# Patient Record
Sex: Male | Born: 1960 | Race: White | Hispanic: No | Marital: Married
Health system: Midwestern US, Community
[De-identification: ages and names within clinical notes are randomized; demographics above are authoritative.]

## PROBLEM LIST (undated history)

## (undated) DIAGNOSIS — E669 Obesity, unspecified: Secondary | ICD-10-CM

## (undated) DIAGNOSIS — C4491 Basal cell carcinoma of skin, unspecified: Secondary | ICD-10-CM

## (undated) DIAGNOSIS — R55 Syncope and collapse: Secondary | ICD-10-CM

## (undated) DIAGNOSIS — I10 Essential (primary) hypertension: Secondary | ICD-10-CM

## (undated) DIAGNOSIS — E079 Disorder of thyroid, unspecified: Secondary | ICD-10-CM

## (undated) DIAGNOSIS — K259 Gastric ulcer, unspecified as acute or chronic, without hemorrhage or perforation: Secondary | ICD-10-CM

## (undated) DIAGNOSIS — E7800 Pure hypercholesterolemia, unspecified: Secondary | ICD-10-CM

## (undated) DIAGNOSIS — N200 Calculus of kidney: Secondary | ICD-10-CM

## (undated) DIAGNOSIS — G4730 Sleep apnea, unspecified: Secondary | ICD-10-CM

## (undated) HISTORY — DX: Calculus of kidney: N20.0

## (undated) HISTORY — DX: Gastric ulcer, unspecified as acute or chronic, without hemorrhage or perforation: K25.9

## (undated) HISTORY — PX: LYMPH NODE BIOPSY: SHX201

## (undated) HISTORY — DX: Basal cell carcinoma of skin, unspecified: C44.91

## (undated) HISTORY — PX: BASAL CELL CARCINOMA EXCISION: SHX1214

## (undated) HISTORY — PX: TONSILLECTOMY: SUR1361

## (undated) HISTORY — DX: Syncope and collapse: R55

## (undated) HISTORY — DX: Sleep apnea, unspecified: G47.30

---

## 2009-03-18 DIAGNOSIS — E079 Disorder of thyroid, unspecified: Secondary | ICD-10-CM

## 2009-03-18 HISTORY — DX: Disorder of thyroid, unspecified: E07.9

## 2010-09-13 ENCOUNTER — Encounter

## 2013-03-18 DIAGNOSIS — G4730 Sleep apnea, unspecified: Secondary | ICD-10-CM

## 2013-03-18 HISTORY — PX: CARDIAC CATHETERIZATION: SHX172

## 2013-03-18 HISTORY — PX: CARDIOVASCULAR STRESS TEST: SHX262

## 2013-03-18 HISTORY — DX: Sleep apnea, unspecified: G47.30

## 2014-11-21 ENCOUNTER — Encounter (HOSPITAL_COMMUNITY): Payer: Self-pay | Admitting: Emergency Medicine

## 2014-11-21 ENCOUNTER — Emergency Department (INDEPENDENT_AMBULATORY_CARE_PROVIDER_SITE_OTHER)
Admission: EM | Admit: 2014-11-21 | Discharge: 2014-11-21 | Disposition: A | Discharge status: Home / Self Care | Payer: BLUE CROSS/BLUE SHIELD | Source: Home / Self Care | Attending: Family Medicine | Admitting: Family Medicine

## 2014-11-21 DIAGNOSIS — N410 Acute prostatitis: Secondary | ICD-10-CM

## 2014-11-21 HISTORY — DX: Essential (primary) hypertension: I10

## 2014-11-21 HISTORY — DX: Disorder of thyroid, unspecified: E07.9

## 2014-11-21 HISTORY — DX: Pure hypercholesterolemia, unspecified: E78.00

## 2014-11-21 LAB — POCT URINALYSIS DIP (DEVICE)
Bilirubin Urine: NEGATIVE
GLUCOSE, UA: NEGATIVE mg/dL
KETONES UR: NEGATIVE mg/dL
Leukocytes, UA: NEGATIVE
NITRITE: NEGATIVE
PH: 6.5 (ref 5.0–8.0)
PROTEIN: NEGATIVE mg/dL
Specific Gravity, Urine: 1.025 (ref 1.005–1.030)
UROBILINOGEN UA: 1 mg/dL (ref 0.0–1.0)

## 2014-11-21 MED ORDER — CIPROFLOXACIN HCL 500 MG PO TABS: 500.0000 mg | ORAL_TABLET | Freq: Two times a day (BID) | ORAL | Status: DC

## 2014-11-21 NOTE — Discharge Instructions (Signed)
Take all of medicine as directed, drink lots of fluids, see urologist recommended for recheck and for kidney stone eval.

## 2014-11-21 NOTE — ED Provider Notes (Signed)
CSN: [id number]     Arrival date & time 11/21/14  1618 History   First MD Initiated Contact with Patient 11/21/14 1642     Chief Complaint  Patient presents with  . Urinary Tract Infection   (Consider location/radiation/quality/duration/timing/severity/associated sxs/prior Treatment) Patient is a 54 y.o. male presenting with frequency. The history is provided by the patient.  Urinary Frequency This is a new problem. The current episode started more than 2 days ago. The problem has been gradually worsening. Associated symptoms include abdominal pain. Associated symptoms comments: Suprapubic pressure. Told he passed a kidney stone 10 d ago, had hematuria..    Past Medical History  Diagnosis Date  . Hypertension   . Thyroid disease   . High cholesterol    Past Surgical History  Procedure Laterality Date  . Tonsillectomy     No family history on file. Social History  Substance Use Topics  . Smoking status: Never Smoker   . Smokeless tobacco: None  . Alcohol Use: Yes    Review of Systems  Constitutional: Negative.  Negative for fever and chills.  Gastrointestinal: Positive for abdominal pain.  Genitourinary: Positive for dysuria, urgency and frequency. Negative for discharge, penile swelling, scrotal swelling, penile pain and testicular pain.  All other systems reviewed and are negative.   Allergies  Codeine  Home Medications   Prior to Admission medications   Medication Sig Start Date End Date Taking? Authorizing Provider  Levothyroxine Sodium (SYNTHROID PO) Take by mouth.   Yes Historical Provider, MD  OMEPRAZOLE PO Take by mouth.   Yes Historical Provider, MD  RAMIPRIL PO Take by mouth.   Yes Historical Provider, MD  Rosuvastatin Calcium (CRESTOR PO) Take by mouth.   Yes Historical Provider, MD  ciprofloxacin (CIPRO) 500 MG tablet Take 1 tablet (500 mg total) by mouth 2 (two) times daily. 11/21/14   Billy Fischer, MD   Meds Ordered and Administered this Visit   Medications - No data to display  BP 138/81 mmHg  Pulse 83  Temp(Src) 98.5 F (36.9 C) (Oral)  Resp 16  SpO2 97% No data found.   Physical Exam  Constitutional: He is oriented to person, place, and time. He appears well-developed and well-nourished. No distress.  Abdominal: Soft. Bowel sounds are normal. He exhibits no distension and no mass. There is no tenderness. There is no rebound and no guarding.  Genitourinary: Penis normal.  Musculoskeletal: Normal range of motion.  Neurological: He is alert and oriented to person, place, and time.  Skin: Skin is warm and dry.  Nursing note and vitals reviewed.   ED Course  Procedures (including critical care time)  Labs Review Labs Reviewed  POCT URINALYSIS DIP (DEVICE) - Abnormal; Notable for the following:    Hgb urine dipstick SMALL (*)    All other components within normal limits   U/a sm bld. Imaging Review No results found.   Visual Acuity Review  Right Eye Distance:   Left Eye Distance:   Bilateral Distance:    Right Eye Near:   Left Eye Near:    Bilateral Near:         MDM   1. Prostatitis, acute     rx cipro 500 bid for 2wks for prostatitis   Billy Fischer, MD 11/21/14 1715

## 2014-11-21 NOTE — ED Notes (Signed)
C/o bladder infection symptoms, onset 3 days ago

## 2015-02-08 ENCOUNTER — Encounter: Payer: Self-pay | Admitting: Gastroenterology

## 2015-02-08 ENCOUNTER — Ambulatory Visit (INDEPENDENT_AMBULATORY_CARE_PROVIDER_SITE_OTHER): Payer: BLUE CROSS/BLUE SHIELD | Admitting: Family Medicine

## 2015-02-08 ENCOUNTER — Encounter: Payer: Self-pay | Admitting: Family Medicine

## 2015-02-08 VITALS — BP 126/83 | HR 70 | Temp 98.3°F | Resp 20 | Ht 73.0 in | Wt 255.5 lb

## 2015-02-08 DIAGNOSIS — I10 Essential (primary) hypertension: Secondary | ICD-10-CM

## 2015-02-08 DIAGNOSIS — R748 Abnormal levels of other serum enzymes: Secondary | ICD-10-CM

## 2015-02-08 DIAGNOSIS — E034 Atrophy of thyroid (acquired): Secondary | ICD-10-CM | POA: Diagnosis not present

## 2015-02-08 DIAGNOSIS — Z7689 Persons encountering health services in other specified circumstances: Secondary | ICD-10-CM | POA: Insufficient documentation

## 2015-02-08 DIAGNOSIS — Z7189 Other specified counseling: Secondary | ICD-10-CM

## 2015-02-08 DIAGNOSIS — K219 Gastro-esophageal reflux disease without esophagitis: Secondary | ICD-10-CM

## 2015-02-08 DIAGNOSIS — R7989 Other specified abnormal findings of blood chemistry: Secondary | ICD-10-CM

## 2015-02-08 DIAGNOSIS — N200 Calculus of kidney: Secondary | ICD-10-CM | POA: Insufficient documentation

## 2015-02-08 DIAGNOSIS — Z0000 Encounter for general adult medical examination without abnormal findings: Secondary | ICD-10-CM

## 2015-02-08 DIAGNOSIS — E785 Hyperlipidemia, unspecified: Secondary | ICD-10-CM | POA: Insufficient documentation

## 2015-02-08 DIAGNOSIS — Z114 Encounter for screening for human immunodeficiency virus [HIV]: Secondary | ICD-10-CM

## 2015-02-08 DIAGNOSIS — L989 Disorder of the skin and subcutaneous tissue, unspecified: Secondary | ICD-10-CM

## 2015-02-08 DIAGNOSIS — E039 Hypothyroidism, unspecified: Secondary | ICD-10-CM | POA: Insufficient documentation

## 2015-02-08 DIAGNOSIS — Z6833 Body mass index (BMI) 33.0-33.9, adult: Secondary | ICD-10-CM | POA: Diagnosis not present

## 2015-02-08 DIAGNOSIS — K2100 Gastro-esophageal reflux disease with esophagitis, without bleeding: Secondary | ICD-10-CM

## 2015-02-08 DIAGNOSIS — E038 Other specified hypothyroidism: Secondary | ICD-10-CM | POA: Diagnosis not present

## 2015-02-08 DIAGNOSIS — N182 Chronic kidney disease, stage 2 (mild): Secondary | ICD-10-CM | POA: Insufficient documentation

## 2015-02-08 DIAGNOSIS — G4733 Obstructive sleep apnea (adult) (pediatric): Secondary | ICD-10-CM

## 2015-02-08 DIAGNOSIS — Z1159 Encounter for screening for other viral diseases: Secondary | ICD-10-CM | POA: Insufficient documentation

## 2015-02-08 DIAGNOSIS — Z1211 Encounter for screening for malignant neoplasm of colon: Secondary | ICD-10-CM | POA: Insufficient documentation

## 2015-02-08 LAB — CBC WITH DIFFERENTIAL/PLATELET
BASOS PCT: 0.4 % (ref 0.0–3.0)
Basophils Absolute: 0 10*3/uL (ref 0.0–0.1)
EOS PCT: 2.4 % (ref 0.0–5.0)
Eosinophils Absolute: 0.1 10*3/uL (ref 0.0–0.7)
HCT: 47.6 % (ref 39.0–52.0)
HEMOGLOBIN: 15.6 g/dL (ref 13.0–17.0)
Lymphocytes Relative: 20.9 % (ref 12.0–46.0)
Lymphs Abs: 1.2 10*3/uL (ref 0.7–4.0)
MCHC: 32.8 g/dL (ref 30.0–36.0)
MCV: 88.2 fl (ref 78.0–100.0)
MONOS PCT: 6.5 % (ref 3.0–12.0)
Monocytes Absolute: 0.4 10*3/uL (ref 0.1–1.0)
Neutro Abs: 3.9 10*3/uL (ref 1.4–7.7)
Neutrophils Relative %: 69.8 % (ref 43.0–77.0)
Platelets: 252 10*3/uL (ref 150.0–400.0)
RBC: 5.39 Mil/uL (ref 4.22–5.81)
RDW: 13.5 % (ref 11.5–15.5)
WBC: 5.6 10*3/uL (ref 4.0–10.5)

## 2015-02-08 LAB — COMPREHENSIVE METABOLIC PANEL
ALBUMIN: 4.8 g/dL (ref 3.5–5.2)
ALK PHOS: 51 U/L (ref 39–117)
ALT: 32 U/L (ref 0–53)
AST: 24 U/L (ref 0–37)
BUN: 21 mg/dL (ref 6–23)
CHLORIDE: 105 meq/L (ref 96–112)
CO2: 29 mEq/L (ref 19–32)
Calcium: 10 mg/dL (ref 8.4–10.5)
Creatinine, Ser: 1.44 mg/dL (ref 0.40–1.50)
GFR: 54.1 mL/min — AB (ref >60.00)
GLUCOSE: 102 mg/dL — AB (ref 70–99)
POTASSIUM: 4.7 meq/L (ref 3.5–5.1)
Sodium: 141 mEq/L (ref 135–145)
TOTAL PROTEIN: 7.2 g/dL (ref 6.0–8.3)
Total Bilirubin: 0.7 mg/dL (ref 0.2–1.2)

## 2015-02-08 LAB — TSH: TSH: 2.02 u[IU]/mL (ref 0.35–4.50)

## 2015-02-08 LAB — LIPID PANEL
CHOL/HDL RATIO: 3
Cholesterol: 147 mg/dL (ref 0–200)
HDL: 43.4 mg/dL (ref >39.00)
LDL CALC: 81 mg/dL (ref 0–99)
NONHDL: 103.2
Triglycerides: 109 mg/dL (ref 0.0–149.0)
VLDL: 21.8 mg/dL (ref 0.0–40.0)

## 2015-02-08 LAB — HEMOGLOBIN A1C: Hgb A1c MFr Bld: 6 % (ref 4.6–6.5)

## 2015-02-08 MED ORDER — FENOFIBRATE 145 MG PO TABS: ORAL_TABLET | ORAL | Status: DC

## 2015-02-08 MED ORDER — OMEPRAZOLE 20 MG PO CPDR: 20.0000 mg | DELAYED_RELEASE_CAPSULE | Freq: Every day | ORAL | Status: DC

## 2015-02-08 MED ORDER — CRESTOR 20 MG PO TABS: ORAL_TABLET | ORAL | Status: DC

## 2015-02-08 MED ORDER — RAMIPRIL 10 MG PO CAPS: 10.0000 mg | ORAL_CAPSULE | Freq: Every day | ORAL | Status: DC

## 2015-02-08 NOTE — Progress Notes (Signed)
Subjective:    Patient ID: Jared Gomez, male    DOB: Oct 08, 1960, 54 y.o.   MRN: [id number]  HPI  Patient presents for new patient establishment, he recently moved to [place] from [place] a few months ago.. All past medical history, surgical history, allergies, family history, immunizations and social history was obtained from the patient today and entered into the electronic medical record. Records are requested from her prior PCP, and will be reviewed at the time they are received. All medical records will be updated at that time.  Nephrolithiasis: Patient states that approximately 6 weeks ago he was in [place] and suffered from a kidney stone. He states it was left-sided and he was seen in the emergency room given medications and sent home. He reports that he has not had urology follow-up, but is in need of getting a local urologist to follow-up on this kidney stone. It started first kidney stone he has ever had. He also states that he had experienced prostatitis that had also been treated shortly after this kidney stone. Patient also states he has had a history of elevated creatinine from an episode of dysentery in [place] many years ago.   Health maintenance:  Colonoscopy: Never had a screening, No Fhx. Immunizations: declined flu shot, Tdap indicated.  Infectious disease screening: HIV and Hep C indicated PSA: Patient states that he has had normal PSAs in the past. He declines GU exam today. Will defer PSA to urology since she's had a recent prostatitis.  Past Medical History  Diagnosis Date  . Hypertension   . High cholesterol   . Cancer ([place])     skin  . Sleep apnea 2015    CPAP  . Thyroid disease 2011  . Nephrolithiasis left    2016   Allergies  Allergen Reactions  . Codeine    Past Surgical History  Procedure Laterality Date  . Tonsillectomy    . Lymph node biopsy    . Basal cell carcinoma excision    . Cardiac catheterization  2013    Normal   Family  History  Problem Relation Age of Onset  . Lung cancer Maternal Grandfather   . Parkinson's disease Father   . Heart disease Father    Social History   Social History  . Marital Status: Married    Spouse Name: N/A  . Number of Children: N/A  . Years of Education: N/A   Occupational History  . Not on file.   Social History Main Topics  . Smoking status: Never Smoker   . Smokeless tobacco: Never Used  . Alcohol Use: Yes     Comment: Occassional   . Drug Use: No  . Sexual Activity: Yes   Other Topics Concern  . Not on file   Social History Narrative   Married to [place]. 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   Master's Degree.   Children: 2 kids   Wears seat belt, smoke detector located in the home, firearms located in the home a locked case.   Patient feels safe in his relationships.    Review of Systems Negative, with the exception of above mentioned in HPI     Objective:   Physical Exam BP 126/83 mmHg  Pulse 70  Temp(Src) 98.3 F (36.8 C) (Oral)  Resp 20  Ht 6' 1" (1.854 m)  Wt 255 lb 8 oz (115.894 kg)  BMI 33.72 kg/m2  SpO2 96% Gen: Afebrile. No acute distress. Nontoxic appearance, well-developed, well-nourished, Caucasian male. Mildly  obese. Very pleasant. HENT: AT. [place]. Bilateral TM visualized and normal in appearance. External auditory canal within normal limits. MMM. No oral lesions. Bilateral nares without erythema or swelling. Throat without erythema or exudates. No cough on exam, no hoarseness on exam. Good dentition Eyes:Pupils Equal Round Reactive to light, Extraocular movements intact,  Conjunctiva without redness, discharge or icterus. Neck/lymp/endocrine: Supple, no lymphadenopathy, no thyromegaly CV: RRR no murmur clicks gallops or rubs, no edema, +2/4 P posterior tibialis pulses Chest: CTAB, no wheeze or crackles Abd: Soft. Obese. NTND. BS present. No Masses palpated. No hepatosplenomegaly MSK: No erythema, no joint swelling, no obvious deformities.  Full range of motion. Neurovascular intact distally. Skin: No rashes, purpura or petechiae. 0.5cm  hyperpidmented, flaky lesion  Neuro: Normal gait. PERLA. EOMi. Alert. Oriented x3 Cranial nerves II through XII intact. Muscle strength 5/5 upper and lower extremity. DTRs equal bilaterally. Psych: Normal affect, dress and demeanor. Normal speech. Normal thought content and judgment..     Assessment & Plan:  Jared Gomez is a 54 y.o. male present for establishment of care. 1. Essential hypertension, benign - Stable, refills called in today. - ramipril (ALTACE) 10 MG capsule; Take 1 capsule (10 mg total) by mouth daily.  Dispense: 90 capsule; Refill: 1 - Comp Met (CMET) - CBC w/Diff  2. Elevated serum creatinine - Avoidance of nephrotoxic agents - BMP  3. Hyperlipidemia - Refills on Crestor. Encouraged low saturated fat diet, plenty of fresh fruits and vegetables and lean meats. - CRESTOR 20 MG tablet; TK 1 T PO QD  Dispense: 90 tablet; Refill: 1 - fenofibrate (TRICOR) 145 MG tablet; TK 1 T PO QD  Dispense: 90 tablet; Refill: 1 - Lipid panel  4. Obstructive sleep apnea - Continue sleep apnea daily at bedtime  5. Hypothyroidism due to acquired atrophy of thyroid - TSH - Recheck TSH today, if the normal range for refill levothyroxine 50 g  6. BMI 33.0-33.9,adult - HgB A1c - Encouraged greater than 150 minutes of exercise daily. Diet plenty of fresh fruits and vegetables, lean meats  7. Nephrolithiasis - Currently asymptomatic, no follow-up after ED visit in [place] 6 weeks ago. Also with prostatitis shortly after. Have placed urology referral. - Defer prostate cancer screening to urology given recent prostatitis - Ambulatory referral to Urology  8. Colon cancer screening - Never has had colon cancer screening, no family history. - Ambulatory referral to Gastroenterology  9. Screening for HIV (human immunodeficiency virus) - Patient amendable to HIV screening - HIV antibody  (with reflex)  10. Need for hepatitis C screening test - Patient amendable to hepatitis C screening - Hepatitis C Antibody  11. Gastroesophageal reflux disease without esophagitis - Continue omeprazole, refills: Today - omeprazole (PRILOSEC) 20 MG capsule; Take 1 capsule (20 mg total) by mouth daily.  Dispense: 90 capsule; Refill: 1  12. Back skin lesion - Patient to monitor left lower back lesion, marked for patient today. Patient has a history of basal cell in the past. If small area of concern does not heal within 2-4 weeks will refer to dermatology.  14. Encounter for preventive health examination Health maintenance:  Colonoscopy: Never had a screening, No Fhx. Referral to gastroenterology for screening colonoscopy placed today.  Immunizations: declined flu shot, Tdap indicated and patient will schedule a nurse visit to have his tetanus completed after the holidays. Infectious disease screening: HIV and Hep C collected today PSA: Patient states that he has had normal PSAs in the past. He declines GU exam today. Will  defer PSA to urology since she's had a recent prostatitis. AVS for health maintenance education given to patient today. He was encouraged to exercise greater than 150 minutes a week. Eat a diet full of fresh fruits and vegetables and lean meats.  Follow-up 6 months for chronic conditions/hypertension

## 2015-02-08 NOTE — Patient Instructions (Addendum)

## 2015-02-09 LAB — HEPATITIS C ANTIBODY: HCV Ab: NEGATIVE

## 2015-02-09 LAB — HIV ANTIBODY (ROUTINE TESTING W REFLEX): HIV: NONREACTIVE

## 2015-02-13 ENCOUNTER — Telehealth: Payer: Self-pay | Admitting: Family Medicine

## 2015-02-13 DIAGNOSIS — R7309 Other abnormal glucose: Secondary | ICD-10-CM | POA: Insufficient documentation

## 2015-02-13 MED ORDER — LEVOTHYROXINE SODIUM 50 MCG PO TABS: ORAL_TABLET | ORAL | Status: DC

## 2015-02-13 NOTE — Telephone Encounter (Signed)
Please call pt, all of his labs are good. With the exception of his a1c, is mildly elevated to 6.0. We will need to monitor this every 6 months, since it puts him at risk for developing diabetes. He should be encouraged to make dietary modifications (healthy fresh vegetables and fruits, lean meats, higher fiber) and execise at least 150  Minutes a week.  - His thyroid medication has also been called in .  - followup 6 months with a1c before rooming.

## 2015-02-13 NOTE — Telephone Encounter (Signed)
LMOM for pt to CB.  

## 2015-02-14 NOTE — Telephone Encounter (Signed)
Pt aware of lab results.  Scheduled 6 month f/u 07/17/15.  Pt had no questions at this time.

## 2015-02-16 DIAGNOSIS — R55 Syncope and collapse: Secondary | ICD-10-CM

## 2015-02-16 HISTORY — DX: Syncope and collapse: R55

## 2015-02-21 ENCOUNTER — Telehealth: Payer: Self-pay | Admitting: *Deleted

## 2015-02-21 MED ORDER — ROSUVASTATIN CALCIUM 20 MG PO TABS
20.0000 mg | ORAL_TABLET | Freq: Every day | ORAL | Status: DC
Start: 2015-02-21 — End: 2015-07-01

## 2015-02-21 NOTE — Telephone Encounter (Signed)
Resent Rx for generic crestor.

## 2015-02-21 NOTE — Telephone Encounter (Signed)
Received a call from Marty Heck at [facility] he states they received a Rx for Crestor 20 mg brand only. They want to know if it was supposed to be brand only because this would cost the patient a significant amount of money or can this be dispensed as generic? Please advise

## 2015-02-21 NOTE — Telephone Encounter (Signed)
I do not believe he needs brand only. Refills were given on new pt appt. On whatever was already in the computer. If pt does not have a preference, I do not and order can be changed

## 2015-03-28 ENCOUNTER — Encounter: Payer: Self-pay | Admitting: Family Medicine

## 2015-04-14 ENCOUNTER — Encounter: Payer: BLUE CROSS/BLUE SHIELD | Admitting: Gastroenterology

## 2015-05-30 ENCOUNTER — Other Ambulatory Visit: Payer: Self-pay | Admitting: *Deleted

## 2015-07-01 ENCOUNTER — Other Ambulatory Visit: Payer: Self-pay | Admitting: Family Medicine

## 2015-07-23 ENCOUNTER — Other Ambulatory Visit: Payer: Self-pay | Admitting: Family Medicine

## 2015-07-24 ENCOUNTER — Encounter: Payer: Self-pay | Admitting: Family Medicine

## 2015-07-24 ENCOUNTER — Ambulatory Visit (INDEPENDENT_AMBULATORY_CARE_PROVIDER_SITE_OTHER): Payer: BLUE CROSS/BLUE SHIELD | Admitting: Family Medicine

## 2015-07-24 VITALS — BP 133/88 | HR 70 | Temp 98.4°F | Resp 20 | Ht 73.0 in | Wt 252.5 lb

## 2015-07-24 DIAGNOSIS — L989 Disorder of the skin and subcutaneous tissue, unspecified: Secondary | ICD-10-CM | POA: Diagnosis not present

## 2015-07-24 DIAGNOSIS — R7309 Other abnormal glucose: Secondary | ICD-10-CM | POA: Diagnosis not present

## 2015-07-24 DIAGNOSIS — I10 Essential (primary) hypertension: Secondary | ICD-10-CM

## 2015-07-24 DIAGNOSIS — E785 Hyperlipidemia, unspecified: Secondary | ICD-10-CM

## 2015-07-24 DIAGNOSIS — Z6833 Body mass index (BMI) 33.0-33.9, adult: Secondary | ICD-10-CM | POA: Diagnosis not present

## 2015-07-24 DIAGNOSIS — E034 Atrophy of thyroid (acquired): Secondary | ICD-10-CM

## 2015-07-24 DIAGNOSIS — Z125 Encounter for screening for malignant neoplasm of prostate: Secondary | ICD-10-CM

## 2015-07-24 DIAGNOSIS — E038 Other specified hypothyroidism: Secondary | ICD-10-CM

## 2015-07-24 LAB — POCT GLYCOSYLATED HEMOGLOBIN (HGB A1C): HEMOGLOBIN A1C: 5.7

## 2015-07-24 MED ORDER — RAMIPRIL 10 MG PO CAPS: 10.0000 mg | ORAL_CAPSULE | Freq: Every day | ORAL | Status: DC

## 2015-07-24 NOTE — Progress Notes (Signed)
Patient ID: Jared Gomez, male   DOB: 1960-06-14, 55 y.o.   MRN: [id number]   Subjective:    Patient ID: Jared Gomez, male    DOB: 23-Mar-1960, 55 y.o.   MRN: [id number]  HPI  Prediabetes: patient was seen 6 months ago with an elevated a1c of 6.0 on his physical. He has since started a daily exercise program and stopped eating sweets. He has lost 3 pounds.   Hypertension/hyperlipidemia:: Patient reports compliance with his medications Altace, Crestor, tricor. He continues to watch his diet and exercise routinely now. He denies chest pain, shortness of breath or LE edema. He will need refills on his Altace today.   Hypothyroid: Patient reports compliance with medications. Denies flushing, palpitations, constipation or fatigue.   Skin Lesion: Pt feels it is basically unchanged skin lesion of his lower back. He states he was watching it for awhile, but then forgot about it. He has had a BCC on his back prior. He is established with dermatology.    Past Medical History  Diagnosis Date  . Hypertension   . High cholesterol   . Cancer ([place])     skin  . Sleep apnea 2015    CPAP  . Thyroid disease 2011  . Nephrolithiasis left    2016  . Gastric ulcer     prior PCP records  . Near syncope 02/2015    "dizziness", improved on own, no work upper prior pcp notes   Allergies  Allergen Reactions  . Codeine    Past Surgical History  Procedure Laterality Date  . Tonsillectomy    . Lymph node biopsy    . Basal cell carcinoma excision    . Cardiac catheterization  2015    Normal per [facility] PCP record  . Cardiovascular stress test  2015    normal per [facility] PCP record   Family History  Problem Relation Age of Onset  . Lung cancer Maternal Grandfather   . Parkinson's disease Father     died age 82  . Heart disease Father     stent placement ~61 yo  . Graves' disease Brother    Social History   Social History  . Marital Status: Married    Spouse Name: N/A  . Number of Children: N/A    . Years of Education: N/A   Occupational History  . Not on file.   Social History Main Topics  . Smoking status: Never Smoker   . Smokeless tobacco: Never Used  . Alcohol Use: Yes     Comment: Occassional   . Drug Use: No  . Sexual Activity: Yes   Other Topics Concern  . 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   Master's Degree.   Children: 2 kids   Wears seat belt, smoke detector located in the home, firearms located in the home a locked case.   Patient feels safe in his relationships.    Review of Systems Negative, with the exception of above mentioned in HPI     Objective:   Physical Exam BP 133/88 mmHg  Pulse 70  Temp(Src) 98.4 F (36.9 C) (Oral)  Resp 20  Ht 6\' 1"  (1.854 m)  Wt 252 lb 8 oz (114.533 kg)  BMI 33.32 kg/m2  SpO2 95% Gen: Afebrile. No acute distress. Nontoxic appearance, well-developed, well-nourished, Caucasian male. Mildly obese. Very pleasant. HENT: AT. .MMM. No oral lesions. Eyes:Pupils Equal Round Reactive to light, Extraocular movements intact,  Conjunctiva without redness, discharge or icterus. Neck/lymp/endocrine: Supple,  no thyromegaly CV: RRR no murmur clicks gallops or rubs, no edema, +2/4 P posterior tibialis pulses Chest: CTAB, no wheeze or crackles Abd: Soft. Obese. NTND. BS present. No Masses palpated. No hepatosplenomegaly Skin: ~1 mm oval red scaly lesion LL back.  Neuro: Normal gait. PERLA. EOMi. Alert. Oriented x3  Psych: Normal affect, dress and demeanor. Normal speech. Normal thought content and judgment.    Assessment & Plan:  Jared Gomez is a 55 y.o. male present for chronic medical condition f/u.  1. Essential hypertension, benign - Stable, refills called in today. - Low sodium diet, daily exercise > 150 minutes a week.  - ramipril (ALTACE) 10 MG capsule; Take 1 capsule (10 mg total) by mouth daily.  Dispense: 90 capsule; Refill: 1  2.  Hyperlipidemia -Encouraged low saturated fat diet, plenty of fresh fruits and vegetables and lean meats. - Continue: CRESTOR 20 MG tablet; TK 1 T PO QD  Dispense: 90 tablet; Refill: 1 - Continue: fenofibrate (TRICOR) 145 MG tablet; TK 1 T PO QD  Dispense: 90 tablet; Refill: 1  3. Hypothyroidism due to acquired atrophy of thyroid - Continue:  levothyroxine 50 g  4. Colon cancer screening - Never has had colon cancer screening, no family history. - Ambulatory referral to Gastroenterology, he had an appt, but needs to reschedule.   5. Back skin lesion - pt encouraged to follow with dermatology; discussed likelihood of basal cell.    Follow-up 6 months for CPE, with fasting labs prior.

## 2015-07-24 NOTE — Patient Instructions (Signed)
Prediabetes Eating Plan Prediabetes--also called impaired glucose tolerance or impaired fasting glucose--is a condition that causes blood sugar (blood glucose) levels to be higher than normal. Following a healthy diet can help to keep prediabetes under control. It can also help to lower the risk of type 2 diabetes and heart disease, which are increased in people who have prediabetes. Along with regular exercise, a healthy diet:  Promotes weight loss.  Helps to control blood sugar levels.  Helps to improve the way that the body uses insulin. WHAT DO I NEED TO KNOW ABOUT THIS EATING PLAN?  Use the glycemic index (GI) to plan your meals. The index tells you how quickly a food will raise your blood sugar. Choose low-GI foods. These foods take a longer time to raise blood sugar.  Pay close attention to the amount of carbohydrates in the food that you eat. Carbohydrates increase blood sugar levels.  Keep track of how many calories you take in. Eating the right amount of calories will help you to achieve a healthy weight. Losing about 7 percent of your starting weight can help to prevent type 2 diabetes.  You may want to follow a Mediterranean diet. This diet includes a lot of vegetables, lean meats or fish, whole grains, fruits, and healthy oils and fats. WHAT FOODS CAN I EAT? Grains Whole grains, such as whole-wheat or whole-grain breads, crackers, cereals, and pasta. Unsweetened oatmeal. Bulgur. Barley. Quinoa. Brown rice. Corn or whole-wheat flour tortillas or taco shells. Vegetables Lettuce. Spinach. Peas. Beets. Cauliflower. Cabbage. Broccoli. Carrots. Tomatoes. Squash. Eggplant. Herbs. Peppers. Onions. Cucumbers. Brussels sprouts. Fruits Berries. Bananas. Apples. Oranges. Grapes. Papaya. Mango. Pomegranate. Kiwi. Grapefruit. Cherries. Meats and Other Protein Sources Seafood. Lean meats, such as chicken and [place] or lean cuts of pork and beef. Tofu. Eggs. Nuts. Beans. Dairy Low-fat or  fat-free dairy products, such as yogurt, cottage cheese, and cheese. Beverages Water. Tea. Coffee. Sugar-free or diet soda. Seltzer water. Milk. Milk alternatives, such as soy or almond milk. Condiments Mustard. Relish. Low-fat, low-sugar ketchup. Low-fat, low-sugar barbecue sauce. Low-fat or fat-free mayonnaise. Sweets and Desserts Sugar-free or low-fat pudding. Sugar-free or low-fat ice cream and other frozen treats. Fats and Oils Avocado. Walnuts. Olive oil. The items listed above may not be a complete list of recommended foods or beverages. Contact your dietitian for more options.  WHAT FOODS ARE NOT RECOMMENDED? Grains Refined white flour and flour products, such as bread, pasta, snack foods, and cereals. Beverages Sweetened drinks, such as sweet iced tea and soda. Sweets and Desserts Baked goods, such as cake, cupcakes, pastries, cookies, and cheesecake. The items listed above may not be a complete list of foods and beverages to avoid. Contact your dietitian for more information.   This information is not intended to replace advice given to you by your health care provider. Make sure you discuss any questions you have with your health care provider.   Document Released: 07/19/2014 Document Reviewed: 07/19/2014 Elsevier Interactive Patient Education [employer].  Follow up in 6 months (after 02/09/2016) for your physical.  Come fasting so we can obtain blood work. Try to schedule  A lab appt a few days before your appt so we will have results.

## 2015-11-01 ENCOUNTER — Other Ambulatory Visit: Payer: Self-pay | Admitting: *Deleted

## 2015-11-01 DIAGNOSIS — I10 Essential (primary) hypertension: Secondary | ICD-10-CM

## 2015-11-01 MED ORDER — FENOFIBRATE 145 MG PO TABS: 145.0000 mg | ORAL_TABLET | Freq: Every day | ORAL | 0 refills | Status: DC

## 2015-11-01 MED ORDER — RAMIPRIL 10 MG PO CAPS: 10.0000 mg | ORAL_CAPSULE | Freq: Every day | ORAL | 0 refills | Status: DC

## 2015-11-01 MED ORDER — LEVOTHYROXINE SODIUM 50 MCG PO TABS: ORAL_TABLET | ORAL | 0 refills | Status: DC

## 2015-11-01 MED ORDER — OMEPRAZOLE 20 MG PO CPDR: 20.0000 mg | DELAYED_RELEASE_CAPSULE | Freq: Every day | ORAL | 0 refills | Status: DC

## 2015-11-01 MED ORDER — ROSUVASTATIN CALCIUM 20 MG PO TABS: 20.0000 mg | ORAL_TABLET | Freq: Every day | ORAL | 0 refills | Status: DC

## 2015-11-01 NOTE — Telephone Encounter (Signed)
[facility]  RF request for ramipril LOV: 07/24/15 Next ov: 02/23/16 Last written: 07/24/15 #90 w/ 1RF  RF request for tricor Last written: 07/03/15 #90 w/ 1RF  RF request for omeprazole Last written: 07/24/15 #90 w/ 1RF  RF request for crestor Last written: 07/03/15 #90 w/ 1RF  RF request for levothyroxine Last written: 07/03/15 #90 w/ 1Rf  02/08/15 TSH 2.02

## 2016-02-05 ENCOUNTER — Other Ambulatory Visit: Payer: Self-pay | Admitting: *Deleted

## 2016-02-20 ENCOUNTER — Other Ambulatory Visit (INDEPENDENT_AMBULATORY_CARE_PROVIDER_SITE_OTHER): Payer: BLUE CROSS/BLUE SHIELD

## 2016-02-20 DIAGNOSIS — I10 Essential (primary) hypertension: Secondary | ICD-10-CM

## 2016-02-20 DIAGNOSIS — L989 Disorder of the skin and subcutaneous tissue, unspecified: Secondary | ICD-10-CM | POA: Diagnosis not present

## 2016-02-20 DIAGNOSIS — Z125 Encounter for screening for malignant neoplasm of prostate: Secondary | ICD-10-CM

## 2016-02-20 DIAGNOSIS — R7309 Other abnormal glucose: Secondary | ICD-10-CM | POA: Diagnosis not present

## 2016-02-20 DIAGNOSIS — Z6833 Body mass index (BMI) 33.0-33.9, adult: Secondary | ICD-10-CM

## 2016-02-20 DIAGNOSIS — E034 Atrophy of thyroid (acquired): Secondary | ICD-10-CM

## 2016-02-20 DIAGNOSIS — E785 Hyperlipidemia, unspecified: Secondary | ICD-10-CM

## 2016-02-20 LAB — CBC WITH DIFFERENTIAL/PLATELET
BASOS PCT: 0.2 % (ref 0.0–3.0)
Basophils Absolute: 0 10*3/uL (ref 0.0–0.1)
EOS PCT: 1.1 % (ref 0.0–5.0)
Eosinophils Absolute: 0.1 10*3/uL (ref 0.0–0.7)
HEMATOCRIT: 47.4 % (ref 39.0–52.0)
HEMOGLOBIN: 16.1 g/dL (ref 13.0–17.0)
LYMPHS PCT: 10.3 % — AB (ref 12.0–46.0)
Lymphs Abs: 1.3 10*3/uL (ref 0.7–4.0)
MCHC: 34 g/dL (ref 30.0–36.0)
MCV: 87.2 fl (ref 78.0–100.0)
MONOS PCT: 4.8 % (ref 3.0–12.0)
Monocytes Absolute: 0.6 10*3/uL (ref 0.1–1.0)
Neutro Abs: 10.4 10*3/uL — ABNORMAL HIGH (ref 1.4–7.7)
Neutrophils Relative %: 83.6 % — ABNORMAL HIGH (ref 43.0–77.0)
Platelets: 254 10*3/uL (ref 150.0–400.0)
RBC: 5.44 Mil/uL (ref 4.22–5.81)
RDW: 13.6 % (ref 11.5–15.5)
WBC: 12.5 10*3/uL — AB (ref 4.0–10.5)

## 2016-02-20 LAB — COMPREHENSIVE METABOLIC PANEL
ALBUMIN: 5 g/dL (ref 3.5–5.2)
ALK PHOS: 47 U/L (ref 39–117)
ALT: 38 U/L (ref 0–53)
AST: 27 U/L (ref 0–37)
BUN: 20 mg/dL (ref 6–23)
CALCIUM: 10 mg/dL (ref 8.4–10.5)
CHLORIDE: 105 meq/L (ref 96–112)
CO2: 28 mEq/L (ref 19–32)
CREATININE: 1.45 mg/dL (ref 0.40–1.50)
GFR: 53.47 mL/min — ABNORMAL LOW (ref >60.00)
Glucose, Bld: 105 mg/dL — ABNORMAL HIGH (ref 70–99)
POTASSIUM: 4.9 meq/L (ref 3.5–5.1)
Sodium: 141 mEq/L (ref 135–145)
Total Bilirubin: 0.7 mg/dL (ref 0.2–1.2)
Total Protein: 7.4 g/dL (ref 6.0–8.3)

## 2016-02-20 LAB — LIPID PANEL
Cholesterol: 162 mg/dL (ref 0–200)
HDL: 43.8 mg/dL (ref >39.00)
LDL Cholesterol: 88 mg/dL (ref 0–99)
NONHDL: 118.66
TRIGLYCERIDES: 153 mg/dL — AB (ref 0.0–149.0)
Total CHOL/HDL Ratio: 4
VLDL: 30.6 mg/dL (ref 0.0–40.0)

## 2016-02-20 LAB — PSA: PSA: 3.63 ng/mL (ref 0.10–4.00)

## 2016-02-20 LAB — HEMOGLOBIN A1C: HEMOGLOBIN A1C: 5.9 % (ref 4.6–6.5)

## 2016-02-20 LAB — TSH: TSH: 2.45 u[IU]/mL (ref 0.35–4.50)

## 2016-02-23 ENCOUNTER — Encounter: Payer: Self-pay | Admitting: Family Medicine

## 2016-02-23 ENCOUNTER — Ambulatory Visit (INDEPENDENT_AMBULATORY_CARE_PROVIDER_SITE_OTHER): Payer: BLUE CROSS/BLUE SHIELD | Admitting: Family Medicine

## 2016-02-23 VITALS — BP 132/83 | HR 81 | Temp 98.4°F | Resp 20 | Ht 73.0 in | Wt 254.5 lb

## 2016-02-23 DIAGNOSIS — L989 Disorder of the skin and subcutaneous tissue, unspecified: Secondary | ICD-10-CM | POA: Diagnosis not present

## 2016-02-23 DIAGNOSIS — Z6833 Body mass index (BMI) 33.0-33.9, adult: Secondary | ICD-10-CM

## 2016-02-23 DIAGNOSIS — Z23 Encounter for immunization: Secondary | ICD-10-CM

## 2016-02-23 DIAGNOSIS — Z0000 Encounter for general adult medical examination without abnormal findings: Secondary | ICD-10-CM

## 2016-02-23 DIAGNOSIS — E034 Atrophy of thyroid (acquired): Secondary | ICD-10-CM | POA: Diagnosis not present

## 2016-02-23 DIAGNOSIS — R7309 Other abnormal glucose: Secondary | ICD-10-CM

## 2016-02-23 DIAGNOSIS — Z1211 Encounter for screening for malignant neoplasm of colon: Secondary | ICD-10-CM

## 2016-02-23 DIAGNOSIS — E785 Hyperlipidemia, unspecified: Secondary | ICD-10-CM

## 2016-02-23 DIAGNOSIS — I10 Essential (primary) hypertension: Secondary | ICD-10-CM | POA: Diagnosis not present

## 2016-02-23 DIAGNOSIS — K219 Gastro-esophageal reflux disease without esophagitis: Secondary | ICD-10-CM

## 2016-02-23 MED ORDER — OMEPRAZOLE 20 MG PO CPDR: 20.0000 mg | DELAYED_RELEASE_CAPSULE | Freq: Every day | ORAL | 3 refills | Status: DC

## 2016-02-23 MED ORDER — ROSUVASTATIN CALCIUM 20 MG PO TABS: 20.0000 mg | ORAL_TABLET | Freq: Every day | ORAL | 3 refills | Status: DC

## 2016-02-23 MED ORDER — RAMIPRIL 10 MG PO CAPS: 10.0000 mg | ORAL_CAPSULE | Freq: Every day | ORAL | 1 refills | Status: DC

## 2016-02-23 MED ORDER — LEVOTHYROXINE SODIUM 50 MCG PO TABS: ORAL_TABLET | ORAL | 3 refills | Status: DC

## 2016-02-23 MED ORDER — FENOFIBRATE 145 MG PO TABS: 145.0000 mg | ORAL_TABLET | Freq: Every day | ORAL | 3 refills | Status: DC

## 2016-02-23 NOTE — Progress Notes (Signed)
Patient ID: Jared Gomez, male  DOB: May 20, 1960, 55 y.o.   MRN: [id number] Patient Care Team    Relationship Specialty Notifications Start End  Ma Hillock, DO PCP - General Family Medicine  02/08/15     Subjective: Jared Gomez is a 55 y.o. male present for CPE. All past medical history, surgical history, allergies, family history, immunizations, medications and social history were updated  in the electronic medical record today. All recent labs, ED visits and hospitalizations within the last year were reviewed.  Hypothyroidism due to acquired atrophy of thyroid Patient reports compliance with levothyroxine 50 g daily on empty stomach. He has no side effects to medication. He denies flushing, palpitations, constipation or diarrhea. TSH was within normal limits today.  Essential hypertension, benign/Hyperlipidemia/BMI 33:  Patient reports compliance with ramipril 10 mg daily. He denies side effects to medication use. He denies chest pain, shortness of breath, dizziness or lower extremity edema. He states his blood pressures have been well-controlled. Patient is prescribed fenofibrate and Crestor 20 mg. He also endorses taking a daily fish oil supplementation.  Back skin lesion Patient reports he forgot to go to dermatology considering his back lesion noticed on prior exam. He has a history of basal cell carcinoma.  Elevated hemoglobin A1c Patient has had an elevated A1c in the past. A1c last year 6.0, 5.7 and today his A1c is 5.9. Patient states he had been exercising routinely and watching his diet closely. He denies any hyperglycemic events, nonhealing wounds or numbness or tingling in his extremities.  Gastroesophageal reflux disease without esophagitis: Patient reports he had tried to wean himself off the Prilosec in the past, but was unable to do so. Without use daily he has return of his stomach burning and nausea.  Health maintenance:  Colonoscopy: scheduling with [facility].  States he will go and reschedule.  Immunizations:  tdap 2014, influenza administered today Infectious disease screening: HIV and Hep C 01/2015. PSA:  Lab Results  Component Value Date   PSA 3.63 02/20/2016  , pt was counseled on prostate cancer screenings.  Assistive device: none Oxygen [id number] Patient has a Dental home. Hospitalizations/ED visits: none  Depression screen PHQ 2/9 02/23/2016  Decreased Interest 0  Down, Depressed, Hopeless 0  PHQ - 2 Score 0   Immunization History  Administered Date(s) Administered  . Influenza,inj,Quad PF,36+ Mos 02/23/2016     Past Medical History:  Diagnosis Date  . Cancer ([place])    skin  . Gastric ulcer    prior PCP records  . High cholesterol   . Hypertension   . Near syncope 02/2015   "dizziness", improved on own, no work upper prior pcp notes  . Nephrolithiasis left   2016  . Sleep apnea 2015   CPAP  . Thyroid disease 2011   Allergies  Allergen Reactions  . Codeine    Past Surgical History:  Procedure Laterality Date  . BASAL CELL CARCINOMA EXCISION    . CARDIAC CATHETERIZATION  2015   Normal per priro PCP record  . CARDIOVASCULAR STRESS TEST  2015   normal per priro PCP record  . LYMPH NODE BIOPSY    . TONSILLECTOMY     Family History  Problem Relation Age of Onset  . Lung cancer Maternal Grandfather   . Parkinson's disease Father     died age 64  . Heart disease Father     stent placement ~36 yo  . Graves' disease Brother    Social History  Social History  . Marital status: Married    Spouse name: N/A  . Number of children: N/A  . Years of education: N/A   Occupational History  . Not on file.   Social History Main Topics  . Smoking status: Never Smoker  . Smokeless tobacco: Never Used  . Alcohol use Yes     Comment: Occassional   . Drug use: No  . Sexual activity: Yes   Other Topics Concern  . Not on file   Social History Narrative   Married to Jared [place]. 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    Master's Degree.   Children: 2 kids   Wears seat belt, smoke detector located in the home, firearms located in the home a locked case.   Patient feels safe in his relationships.     Medication List       Accurate as of 02/23/16  9:12 AM. Always use your most recent med list.          fenofibrate 145 MG tablet Commonly known as:  TRICOR Take 1 tablet (145 mg total) by mouth daily.   levothyroxine 50 MCG tablet Commonly known as:  SYNTHROID, LEVOTHROID Take 1 tablet by mouth  daily on an empty stomach   omeprazole 20 MG capsule Commonly known as:  PRILOSEC Take 1 capsule (20 mg total) by mouth daily.   ramipril 10 MG capsule Commonly known as:  ALTACE Take 1 capsule (10 mg total) by mouth daily.   rosuvastatin 20 MG tablet Commonly known as:  CRESTOR Take 1 tablet (20 mg total) by mouth daily.        Recent Results (from the past 2160 hour(s))  CBC w/Diff     Status: Abnormal   Collection Time: 02/20/16  8:02 AM  Result Value Ref Range   WBC 12.5 (H) 4.0 - 10.5 K/uL   RBC 5.44 4.22 - 5.81 Mil/uL   Hemoglobin 16.1 13.0 - 17.0 g/dL   HCT 47.4 39.0 - 52.0 %   MCV 87.2 78.0 - 100.0 fl   MCHC 34.0 30.0 - 36.0 g/dL   RDW 13.6 11.5 - 15.5 %   Platelets 254.0 150.0 - 400.0 K/uL   Neutrophils Relative % 83.6 (H) 43.0 - 77.0 %   Lymphocytes Relative 10.3 (L) 12.0 - 46.0 %   Monocytes Relative 4.8 3.0 - 12.0 %   Eosinophils Relative 1.1 0.0 - 5.0 %   Basophils Relative 0.2 0.0 - 3.0 %   Neutro Abs 10.4 (H) 1.4 - 7.7 K/uL   Lymphs Abs 1.3 0.7 - 4.0 K/uL   Monocytes Absolute 0.6 0.1 - 1.0 K/uL   Eosinophils Absolute 0.1 0.0 - 0.7 K/uL   Basophils Absolute 0.0 0.0 - 0.1 K/uL  Comprehensive metabolic panel     Status: Abnormal   Collection Time: 02/20/16  8:02 AM  Result Value Ref Range   Sodium 141 135 - 145 mEq/L   Potassium 4.9 3.5 - 5.1 mEq/L   Chloride 105 96 - 112 mEq/L   CO2 28 19 - 32 mEq/L   Glucose, Bld 105 (H) 70 - 99 mg/dL   BUN 20 6 - 23 mg/dL    Creatinine, Ser 1.45 0.40 - 1.50 mg/dL   Total Bilirubin 0.7 0.2 - 1.2 mg/dL   Alkaline Phosphatase 47 39 - 117 U/L   AST 27 0 - 37 U/L   ALT 38 0 - 53 U/L   Total Protein 7.4 6.0 - 8.3 g/dL   Albumin 5.0 3.5 - 5.2  g/dL   Calcium 10.0 8.4 - 10.5 mg/dL   GFR 53.47 (L) >60.00 mL/min  HgB A1c     Status: None   Collection Time: 02/20/16  8:02 AM  Result Value Ref Range   Hgb A1c MFr Bld 5.9 4.6 - 6.5 %    Comment: Glycemic Control Guidelines for People with Diabetes:Non Diabetic:  <6%Goal of Therapy: <7%Additional Action Suggested:  >8%   Lipid panel     Status: Abnormal   Collection Time: 02/20/16  8:02 AM  Result Value Ref Range   Cholesterol 162 0 - 200 mg/dL    Comment: ATP III Classification       Desirable:  < 200 mg/dL               Borderline High:  200 - 239 mg/dL          High:  > = 240 mg/dL   Triglycerides 153.0 (H) 0.0 - 149.0 mg/dL    Comment: Normal:  <150 mg/dLBorderline High:  150 - 199 mg/dL   HDL 43.80 >39.00 mg/dL   VLDL 30.6 0.0 - 40.0 mg/dL   LDL Cholesterol 88 0 - 99 mg/dL   Total CHOL/HDL Ratio 4     Comment:                Men          Women1/2 Average Risk     3.4          3.3Average Risk          5.0          4.42X Average Risk          9.6          7.13X Average Risk          15.0          11.0                       NonHDL 118.66     Comment: NOTE:  Non-HDL goal should be 30 mg/dL higher than patient's LDL goal (i.e. LDL goal of < 70 mg/dL, would have non-HDL goal of < 100 mg/dL)  TSH     Status: None   Collection Time: 02/20/16  8:02 AM  Result Value Ref Range   TSH 2.45 0.35 - 4.50 uIU/mL  PSA     Status: None   Collection Time: 02/20/16  8:02 AM  Result Value Ref Range   PSA 3.63 0.10 - 4.00 ng/mL    No results found.   ROS: 14 pt review of systems performed and negative (unless mentioned in an HPI)  Objective: BP 132/83 (BP Location: Left Arm, Patient Position: Sitting, Cuff Size: Large)   Pulse 81   Temp 98.4 F (36.9 C)   Resp 20   Ht 6'  1" (1.854 m)   Wt 254 lb 8 oz (115.4 kg)   SpO2 99%   BMI 33.58 kg/m  Gen: Afebrile. No acute distress. Nontoxic in appearance, well-developed, well-nourished,  Pleasant, obese, caucasian male.  HENT: AT. Jared Gomez. Bilateral TM visualized and normal in appearance, normal external auditory canal. MMM, no oral lesions, adequate dentition. Bilateral nares within normal limits. Throat without erythema, ulcerations or exudates. no Cough on exam, no hoarseness on exam. Eyes:Pupils Equal Round Reactive to light, Extraocular movements intact,  Conjunctiva without redness, discharge or icterus. Neck/lymp/endocrine: Supple,no lymphadenopathy, no thyromegaly CV: RRR no murmur, no edema, +2/4 P posterior tibialis pulses.  no carotid bruits. No JVD. Chest: CTAB, no wheeze, rhonchi or crackles. Normal  Respiratory effort. Good  Air movement. Abd: Soft. obese. NTND. BS present. no Masses palpated. No hepatosplenomegaly. No rebound tenderness or guarding. GU: pt deferred Skin: no rashes, purpura or petechiae. Warm and well-perfused. Skin intact. Neuro/Msk:  Normal gait. PERLA. EOMi. Alert. Oriented x3.  Cranial nerves II through XII intact. Muscle strength 5/5 upper/lower extremity. DTRs equal bilaterally. Psych: Normal affect, dress and demeanor. Normal speech. Normal thought content and judgment.   Assessment/plan: Kadesh Sullo is a 55 y.o. male present for CPE Encounter for preventive health examination Patient was encouraged to exercise greater than 150 minutes a week. Patient was encouraged to choose a diet filled with fresh fruits and vegetables, and lean meats. AVS provided to patient today for education/recommendation on gender specific health and safety maintenance. Colonoscopy: scheduling with [facility]. States he will go and reschedule.  Immunizations:  tdap 2014, influenza administered today Infectious disease screening: HIV and Hep C 01/2015. PSA: < 4. GU referred. No changes in urinary stream  reported.   Hypothyroidism due to acquired atrophy of thyroid - stable.  - TSH normal, follow yearly. Refill 12 months provided.  - levothyroxine (SYNTHROID, LEVOTHROID) 50 MCG tablet; Take 1 tablet by mouth  daily on an empty stomach  Dispense: 90 tablet; Refill: 3  Essential hypertension, benign - stable.  - continue Altace 10 mg QD, low salt diet and exercise.  - refills provided for 6 months - ramipril (ALTACE) 10 MG capsule; Take 1 capsule (10 mg total) by mouth daily.  Dispense: 90 capsule; Refill: 1 - F/U q 6 months  Back skin lesion - lesion still present, appears more red today than prior. Again advised to see dermatology. He states he will, he had just forgot about it.   Hyperlipidemia, unspecified hyperlipidemia type/BMI 33.0-33.9,adult - triglycerides mildly elevated. Discussed diet, exercise and fish oil supplement. He is prescribed statin and fenofibrate. - Discussed benefits of statin in length. Pt was wondering if he can ever come of statin.  - rosuvastatin (CRESTOR) 20 MG tablet; Take 1 tablet (20 mg total) by mouth daily.  Dispense: 90 tablet; Refill: 3 - follow yearly.   Colon cancer screening Pt states he promises he will reschedule the appt for his screening colonoscopy. Discussed with him the possible option of cologuard, but he states he will schedule the colonoscopy.   Elevated hemoglobin A1c - still mildly elevated today 5.9 - diet and exercise discussed.  - monitor in 6 months.   Gastroesophageal reflux disease without esophagitis - Pt to try QOD dosing. He has been unable to discontinue medication completely.  - refills on Prilosec 20 mg today.   Need for prophylactic vaccination and inoculation against influenza - Flu Vaccine QUAD 36+ mos PF IM (Fluarix & Fluzone Quad PF)    Return in about 6 months (around 08/23/2016), or HTN. 1 year for CPE  Electronically signed by: Howard Pouch, DO [place]

## 2016-02-23 NOTE — Patient Instructions (Signed)

## 2016-03-21 ENCOUNTER — Encounter: Payer: Self-pay | Admitting: Gastroenterology

## 2016-05-06 ENCOUNTER — Ambulatory Visit (AMBULATORY_SURGERY_CENTER): Payer: Self-pay

## 2016-05-06 VITALS — Ht 73.0 in | Wt 262.2 lb

## 2016-05-06 DIAGNOSIS — Z1211 Encounter for screening for malignant neoplasm of colon: Secondary | ICD-10-CM

## 2016-05-06 MED ORDER — NA SULFATE-K SULFATE-MG SULF 17.5-3.13-1.6 GM/177ML PO SOLN: 1.0000 | Freq: Once | ORAL | 0 refills | Status: AC

## 2016-05-06 NOTE — Progress Notes (Signed)
Denies allergies to eggs or soy products. Denies complication of anesthesia or sedation. Denies use of weight loss medication. Denies use of O2.   Emmi instructions given for colonoscopy.  

## 2016-05-09 ENCOUNTER — Encounter: Payer: Self-pay | Admitting: Gastroenterology

## 2016-05-16 HISTORY — PX: COLONOSCOPY: SHX174

## 2016-05-20 ENCOUNTER — Encounter: Payer: Self-pay | Admitting: Gastroenterology

## 2016-05-20 ENCOUNTER — Ambulatory Visit (AMBULATORY_SURGERY_CENTER): Payer: BLUE CROSS/BLUE SHIELD | Admitting: Gastroenterology

## 2016-05-20 VITALS — BP 108/75 | HR 70 | Temp 98.0°F | Resp 23 | Ht 73.0 in | Wt 254.0 lb

## 2016-05-20 DIAGNOSIS — D123 Benign neoplasm of transverse colon: Secondary | ICD-10-CM

## 2016-05-20 DIAGNOSIS — D120 Benign neoplasm of cecum: Secondary | ICD-10-CM | POA: Diagnosis not present

## 2016-05-20 DIAGNOSIS — Z1211 Encounter for screening for malignant neoplasm of colon: Secondary | ICD-10-CM

## 2016-05-20 DIAGNOSIS — Z1212 Encounter for screening for malignant neoplasm of rectum: Secondary | ICD-10-CM | POA: Diagnosis not present

## 2016-05-20 MED ORDER — SODIUM CHLORIDE 0.9 % IV SOLN: 500.0000 mL | INTRAVENOUS | Status: DC

## 2016-05-20 NOTE — Progress Notes (Signed)
A/ox3, pleased with MAC, report to RN 

## 2016-05-20 NOTE — Progress Notes (Signed)
Called to room to assist during endoscopic procedure.  Patient ID and intended procedure confirmed with present staff. Received instructions for my participation in the procedure from the performing physician.  

## 2016-05-20 NOTE — Op Note (Signed)
[facility] Patient Name: Erick Aye Procedure Date: 05/20/2016 11:38 AM MRN: [id number] Endoscopist: Mauri Pole , MD Age: 56 Referring MD:  Date of Birth: Jun 05, 1960 Gender: Male Account #: [id number] Procedure:                Colonoscopy Indications:              Screening for colorectal malignant neoplasm, This                            is the patient's first colonoscopy Medicines:                Monitored Anesthesia Care Procedure:                Pre-Anesthesia Assessment:                           - Prior to the procedure, a History and Physical                            was performed, and patient medications and                            allergies were reviewed. The patient's tolerance of                            previous anesthesia was also reviewed. The risks                            and benefits of the procedure and the sedation                            options and risks were discussed with the patient.                            All questions were answered, and informed consent                            was obtained. Prior Anticoagulants: The patient has                            taken no previous anticoagulant or antiplatelet                            agents. ASA Grade Assessment: II - A patient with                            mild systemic disease. After reviewing the risks                            and benefits, the patient was deemed in                            satisfactory condition to undergo the procedure.  After obtaining informed consent, the colonoscope                            was passed under direct vision. Throughout the                            procedure, the patient's blood pressure, pulse, and                            oxygen saturations were monitored continuously. The                            Colonoscope was introduced through the anus and                            advanced to the the  terminal ileum, with                            identification of the appendiceal orifice and IC                            valve. The colonoscopy was performed without                            difficulty. The patient tolerated the procedure                            well. The quality of the bowel preparation was                            good. The terminal ileum, ileocecal valve,                            appendiceal orifice, and rectum were photographed. Scope In: 11:48:58 AM Scope Out: [id number] PM Scope Withdrawal Time: 0 hours 12 minutes 26 seconds  Total Procedure Duration: 0 hours 17 minutes 20 seconds  Findings:                 The perianal and digital rectal examinations were                            normal.                           A 11 mm polyp was found in the appendiceal orifice.                            The polyp was flat. The polyp was removed with a                            hot snare. Resection and retrieval were complete.                           Three sessile polyps were found in the transverse  colon. The polyps were 1 to 2 mm in size. These                            polyps were removed with a cold biopsy forceps.                            Resection and retrieval were complete.                           Non-bleeding internal hemorrhoids were found during                            retroflexion. The hemorrhoids were small.                           A few small-mouthed diverticula were found in the                            sigmoid colon. Complications:            No immediate complications. Estimated Blood Loss:     Estimated blood loss was minimal. Impression:               - One 11 mm polyp at the appendiceal orifice,                            removed with a hot snare. Resected and retrieved.                           - Three 1 to 2 mm polyps in the transverse colon,                            removed with a cold biopsy  forceps. Resected and                            retrieved.                           - Non-bleeding internal hemorrhoids.                           - Diverticulosis in the sigmoid colon. Recommendation:           - Patient has a contact number available for                            emergencies. The signs and symptoms of potential                            delayed complications were discussed with the                            patient. Return to normal activities tomorrow.  Written discharge instructions were provided to the                            patient.                           - Resume previous diet.                           - Continue present medications.                           - Await pathology results.                           - Repeat colonoscopy in 3 - 5 years for                            surveillance based on pathology results. Mauri Pole, MD 05/20/2016 12:11:48 PM This report has been signed electronically.

## 2016-05-20 NOTE — Patient Instructions (Signed)
Colon polyps removed today. Handouts given on polyps,hemorrhoids,diverticulosis. Result letter in your mail in 2-3 weeks. DO NOT take any aspirin,ibuprofen, aleve, or any other non-steriodal anti-inflammatory medications for 2 weeks. Call us with any questions or concerns. Thank you!!   YOU HAD AN ENDOSCOPIC PROCEDURE TODAY AT [facility]:   Refer to the procedure report that was given to you for any specific questions about what was found during the examination.  If the procedure report does not answer your questions, please call your gastroenterologist to clarify.  If you requested that your care partner not be given the details of your procedure findings, then the procedure report has been included in a sealed envelope for you to review at your convenience later.  YOU SHOULD EXPECT: Some feelings of bloating in the abdomen. Passage of more gas than usual.  Walking can help get rid of the air that was put into your GI tract during the procedure and reduce the bloating. If you had a lower endoscopy (such as a colonoscopy or flexible sigmoidoscopy) you may notice spotting of blood in your stool or on the toilet paper. If you underwent a bowel prep for your procedure, you may not have a normal bowel movement for a few days.  Please Note:  You might notice some irritation and congestion in your nose or some drainage.  This is from the oxygen used during your procedure.  There is no need for concern and it should clear up in a day or so.  SYMPTOMS TO REPORT IMMEDIATELY:   Following lower endoscopy (colonoscopy or flexible sigmoidoscopy):  Excessive amounts of blood in the stool  Significant tenderness or worsening of abdominal pains  Swelling of the abdomen that is new, acute  Fever of 100F or higher   For urgent or emergent issues, a gastroenterologist can be reached at any hour by calling [phone].   DIET:  We do recommend a small meal at first, but then you may proceed  to your regular diet.  Drink plenty of fluids but you should avoid alcoholic beverages for 24 hours.  ACTIVITY:  You should plan to take it easy for the rest of today and you should NOT DRIVE or use heavy machinery until tomorrow (because of the sedation medicines used during the test).    FOLLOW UP: Our staff will call the number listed on your records the next business day following your procedure to check on you and address any questions or concerns that you may have regarding the information given to you following your procedure. If we do not reach you, we will leave a message.  However, if you are feeling well and you are not experiencing any problems, there is no need to return our call.  We will assume that you have returned to your regular daily activities without incident.  If any biopsies were taken you will be contacted by phone or by letter within the next 1-3 weeks.  Please call us at [phone] if you have not heard about the biopsies in 3 weeks.    SIGNATURES/CONFIDENTIALITY: You and/or your care partner have signed paperwork which will be entered into your electronic medical record.  These signatures attest to the fact that that the information above on your After Visit Summary has been reviewed and is understood.  Full responsibility of the confidentiality of this discharge information lies with you and/or your care-partner.

## 2016-05-21 ENCOUNTER — Telehealth: Payer: Self-pay | Admitting: *Deleted

## 2016-05-21 ENCOUNTER — Telehealth: Payer: Self-pay

## 2016-05-21 NOTE — Telephone Encounter (Signed)
  Follow up Call-  Call back number 05/20/2016  Post procedure Call Back phone  # [phone]  Permission to leave phone message Yes     Patient questions:  Do you have a fever, pain , or abdominal swelling? No. Pain Score  0 *  Have you tolerated food without any problems? Yes.    Have you been able to return to your normal activities? Yes.    Do you have any questions about your discharge instructions: Diet   No. Medications  No. Follow up visit  No.  Do you have questions or concerns about your Care? No.  Actions: * If pain score is 4 or above: No action needed, pain <4.

## 2016-05-21 NOTE — Telephone Encounter (Signed)
No answer. Left message that a staff member will attempt to reach him again later today. 

## 2016-05-24 ENCOUNTER — Encounter: Payer: Self-pay | Admitting: Gastroenterology

## 2016-05-24 NOTE — Progress Notes (Signed)
Well

## 2016-07-31 ENCOUNTER — Other Ambulatory Visit: Payer: Self-pay | Admitting: Family Medicine

## 2016-07-31 DIAGNOSIS — I10 Essential (primary) hypertension: Secondary | ICD-10-CM

## 2016-08-23 ENCOUNTER — Ambulatory Visit (INDEPENDENT_AMBULATORY_CARE_PROVIDER_SITE_OTHER): Payer: BLUE CROSS/BLUE SHIELD | Admitting: Family Medicine

## 2016-08-23 ENCOUNTER — Encounter: Payer: Self-pay | Admitting: Family Medicine

## 2016-08-23 VITALS — BP 125/82 | HR 76 | Temp 98.2°F | Resp 16 | Wt 252.0 lb

## 2016-08-23 DIAGNOSIS — R7309 Other abnormal glucose: Secondary | ICD-10-CM

## 2016-08-23 DIAGNOSIS — Z6833 Body mass index (BMI) 33.0-33.9, adult: Secondary | ICD-10-CM | POA: Diagnosis not present

## 2016-08-23 DIAGNOSIS — L989 Disorder of the skin and subcutaneous tissue, unspecified: Secondary | ICD-10-CM

## 2016-08-23 DIAGNOSIS — I10 Essential (primary) hypertension: Secondary | ICD-10-CM | POA: Diagnosis not present

## 2016-08-23 DIAGNOSIS — E785 Hyperlipidemia, unspecified: Secondary | ICD-10-CM | POA: Diagnosis not present

## 2016-08-23 DIAGNOSIS — N182 Chronic kidney disease, stage 2 (mild): Secondary | ICD-10-CM

## 2016-08-23 LAB — POCT GLYCOSYLATED HEMOGLOBIN (HGB A1C): Hemoglobin A1C: 5.5

## 2016-08-23 MED ORDER — RAMIPRIL 10 MG PO CAPS
10.0000 mg | ORAL_CAPSULE | Freq: Every day | ORAL | 1 refills | Status: DC
Start: 2016-08-23 — End: 2017-01-31

## 2016-08-23 NOTE — Progress Notes (Signed)
Jared Gomez , January 08, 1961, 56 y.o., male MRN: [id number] Patient Care Team    Relationship Specialty Notifications Start End  Ma Hillock, DO PCP - General Family Medicine  02/08/15     Chief Complaint  Patient presents with  . Hypertension    Follow up     Subjective:  Essential hypertension, benign/Hyperlipidemia/CKD2 Pt reports compliance with Altace 10 mg QD. Blood pressures ranges at home WNL. Patient denies chest pain, shortness of breath or lower extremity edema. Ptitakes daily baby ASA. Pt is  prescribed statin and fenofibrate. He also takes daily fish oil.  BMP: 12/5/2017GFR 53, cr 1.45 CBC: 02/20/2016 WNL Lipids: 02/20/2016,  Good, monitor yearly Diet: low sodium, changed to raw veggies for lunch Exercise: exercising routinely.  RF: HLD., HTN, Fhx HD  Elevated hemoglobin A1c/BMI 33 Last a1c  5.9,  6 months ago. Working out now and eating better.    Depression screen PHQ 2/9 02/23/2016  Decreased Interest 0  Down, Depressed, Hopeless 0  PHQ - 2 Score 0    Allergies  Allergen Reactions  . Codeine    Social History  Substance Use Topics  . Smoking status: Never Smoker  . Smokeless tobacco: Never Used  . Alcohol use Yes     Comment: Occassional    Past Medical History:  Diagnosis Date  . Cancer ([place])    skin  . Gastric ulcer    prior PCP records  . High cholesterol   . Hypertension   . Near syncope 02/2015   "dizziness", improved on own, no work upper prior pcp notes  . Nephrolithiasis left   2016  . Sleep apnea 2015   CPAP  . Thyroid disease 2011   Past Surgical History:  Procedure Laterality Date  . BASAL CELL CARCINOMA EXCISION    . CARDIAC CATHETERIZATION  2015   Normal per priro PCP record  . CARDIOVASCULAR STRESS TEST  2015   normal per priro PCP record  . COLONOSCOPY  05/2016  . LYMPH NODE BIOPSY    . TONSILLECTOMY     Family History  Problem Relation Age of Onset  . Lung cancer Maternal Grandfather   . Parkinson's disease  Father        died age 69  . Heart disease Father        stent placement ~40 yo  . Graves' disease Brother   . Colon cancer Neg Hx   . Esophageal cancer Neg Hx   . Rectal cancer Neg Hx   . Stomach cancer Neg Hx    Allergies as of 08/23/2016      Reactions   Codeine       Medication List       Accurate as of 08/23/16  8:04 AM. Always use your most recent med list.          aspirin 81 MG tablet Take 81 mg by mouth daily.   Coenzyme Q10 200 MG capsule Take 200 mg by mouth daily.   fenofibrate 145 MG tablet Commonly known as:  TRICOR Take 1 tablet (145 mg total) by mouth daily.   Fish Oil 1200 MG Caps Take 1 capsule by mouth daily.   levothyroxine 50 MCG tablet Commonly known as:  SYNTHROID, LEVOTHROID Take 1 tablet by mouth  daily on an empty stomach   omeprazole 20 MG capsule Commonly known as:  PRILOSEC Take 1 capsule (20 mg total) by mouth daily.   ramipril 10 MG capsule Commonly known as:  ALTACE TAKE  1 CAPSULE BY MOUTH  DAILY   rosuvastatin 20 MG tablet Commonly known as:  CRESTOR Take 1 tablet (20 mg total) by mouth daily.       All past medical history, surgical history, allergies, family history, immunizations andmedications were updated in the EMR today and reviewed under the history and medication portions of their EMR.     ROS: Negative, with the exception of above mentioned in HPI   Objective:  BP 125/82 (BP Location: Right Arm, Patient Position: Sitting, Cuff Size: Large)   Pulse 76   Temp 98.2 F (36.8 C) (Oral)   Resp 16   Wt 252 lb (114.3 kg)   SpO2 95%   BMI 33.25 kg/m  Body mass index is 33.25 kg/m. Gen: Afebrile. No acute distress. Nontoxic in appearance, well developed, well nourished. Very pleasant caucasian male.  HENT: AT. [place]. MMM, no oral lesions.  Eyes:Pupils Equal Round Reactive to light, Extraocular movements intact,  Conjunctiva without redness, discharge or icterus. CV: RRR, no edema Chest: CTAB, no wheeze or crackles.  Good air movement, normal resp effort.  Abd: Soft. NTND. BS present. Skin: 2 small erythremic back lesions. no rashes, purpura or petechiae. WWP, intact. Neuro: Normal gait. PERLA. EOMi. Alert. Oriented x3   No exam data present No results found. No results found for this or any previous visit (from the past 24 hour(s)).  Assessment/Plan: Jared Gomez is a 56 y.o. male present for OV for  Essential hypertension, benign - Stable today, refills on altace 10 mg qd for 6 months.  - low salt diet and exercise.  - ramipril (ALTACE) 10 MG capsule; Take 1 capsule (10 mg total) by mouth daily.  Dispense: 90 capsule; Refill: 1 - F/U q 6 months  Back skin lesion - prior lesion and now new lesion both on his back  - Again advised to see dermatology.  - agreed to referral today to expedite process.  - derm referral placed.    Hyperlipidemia, unspecified hyperlipidemia type/BMI 33.0-33.9,adult - continue fish oil, fenofibrate and crestor. No refills needed today - follow yearly lipids at physical.   Elevated hemoglobin A1c - 5.9 --> 5.5. Great work. Continue diet and exercise, making a huge difference. Follow screening with physicals only.    Reviewed expectations re: course of current medical issues.  Discussed self-management of symptoms.  Outlined signs and symptoms indicating need for more acute intervention.  Patient verbalized understanding and all questions were answered.  Patient received an After-Visit Summary.     Note is dictated utilizing voice recognition software. Although note has been proof read prior to signing, occasional typographical errors still can be missed. If any questions arise, please do not hesitate to call for verification.   electronically signed by:  Howard Pouch, DO  [place]

## 2016-08-23 NOTE — Patient Instructions (Signed)
I have refilled your BP medication. Everything looks great today.  Diabetes screen is much improved and now normal! [place] job!!!  Follow in 6 months (probably physical time)   Please help [place] help you:  We are honored you have chosen [facility] for your Primary Care home. Below you will find basic instructions that you may need to access in the future. Please help [place] help you by reading the instructions, which cover many of the frequent questions we experience.   Prescription refills and request:  -In order to allow more efficient response time, please call your pharmacy for all refills. They will forward the request electronically to [place]. This allows for the quickest possible response. Request left on a nurse line can take longer to refill, since these are checked as time allows between office patients and other phone calls.  - refill request can take up to 3-5 working days to complete.  - If request is sent electronically and request is appropiate, it is usually completed in 1-2 business days.  - all patients will need to be seen routinely for all chronic medical conditions requiring prescription medications (see follow-up below). If you are overdue for follow up on your condition, you will be asked to make an appointment and we will call in enough medication to cover you until your appointment (up to 30 days).  - all controlled substances will require a face to face visit to request/refill.  - if you desire your prescriptions to go through a new pharmacy, and have an active script at original pharmacy, you will need to call your pharmacy and have scripts transferred to new pharmacy. This is completed between the pharmacy locations and not by your provider.    Results: If any images or labs were ordered, it can take up to 1 week to get results depending on the test ordered and the lab/facility running and resulting the test. - Normal or stable results, which do not need further discussion,  may be released to your mychart immediately with attached note to you. A call may not be generated for normal results. Please make certain to sign up for mychart. If you have questions on how to activate your mychart you can call the front office.  - If your results need further discussion, our office will attempt to contact you via phone, and if unable to reach you after 2 attempts, we will release your abnormal result to your mychart with instructions.  - All results will be automatically released in mychart after 1 week.  - Your provider will provide you with explanation and instruction on all relevant material in your results. Please keep in mind, results and labs may appear confusing or abnormal to the untrained eye, but it does not mean they are actually abnormal for you personally. If you have any questions about your results that are not covered, or you desire more detailed explanation than what was provided, you should make an appointment with your provider to do so.   Our office handles many outgoing and incoming calls daily. If we have not contacted you within 1 week about your results, please check your mychart to see if there is a message first and if not, then contact our office.  In helping with this matter, you help decrease call volume, and therefore allow [place] to be able to respond to patients needs more efficiently.   Acute office visits (sick visit):  An acute visit is intended for a new problem and  are scheduled in shorter time slots to allow schedule openings for patients with new problems. This is the appropriate visit to discuss a new problem. In order to provide you with excellent quality medical care with proper time for you to explain your problem, have an exam and receive treatment with instructions, these appointments should be limited to one new problem per visit. If you experience a new problem, in which you desire to be addressed, please make an acute office visit, we save  openings on the schedule to accommodate you. Please do not save your new problem for any other type of visit, let us take care of it properly and quickly for you.   Follow up visits:  Depending on your condition(s) your provider will need to see you routinely in order to provide you with quality care and prescribe medication(s). Most chronic conditions (Example: hypertension, Diabetes, depression/anxiety... etc), require visits a couple times a year. Your provider will instruct you on proper follow up for your personal medical conditions and history. Please make certain to make follow up appointments for your condition as instructed. Failing to do so could result in lapse in your medication treatment/refills. If you request a refill, and are overdue to be seen on a condition, we will always provide you with a 30 day script (once) to allow you time to schedule.    Medicare wellness (well visit): - we have a wonderful Nurse Maudie Mercury), that will meet with you and provide you will yearly medicare wellness visits. These visits should occur yearly (can not be scheduled less than 1 calendar year apart) and cover preventive health, immunizations, advance directives and screenings you are entitled to yearly through your medicare benefits. Do not miss out on your entitled benefits, this is when medicare will pay for these benefits to be ordered for you.  These are strongly encouraged by your provider and is the appropriate type of visit to make certain you are up to date with all preventive health benefits. If you have not had your medicare wellness exam in the last 12 months, please make certain to schedule one by calling the office and schedule your medicare wellness with Maudie Mercury as soon as possible.   Yearly physical (well visit):  - Adults are recommended to be seen yearly for physicals. Check with your insurance and date of your last physical, most insurances require one calendar year between physicals. Physicals  include all preventive health topics, screenings, medical exam and labs that are appropriate for gender/age and history. You may have fasting labs needed at this visit. This is a well visit (not a sick visit), new problems should not be covered during this visit (see acute visit).  - Pediatric patients are seen more frequently when they are younger. Your provider will advise you on well child visit timing that is appropriate for your their age. - This is not a medicare wellness visit. Medicare wellness exams do not have an exam portion to the visit. Some medicare companies allow for a physical, some do not allow a yearly physical. If your medicare allows a yearly physical you can schedule the medicare wellness with our nurse Maudie Mercury and have your physical with your provider after, on the same day. Please check with insurance for your full benefits.   Late Policy/No Shows:  - all new patients should arrive 15-30 minutes earlier than appointment to allow [place] time  to  obtain all personal demographics,  insurance information and for you to complete office paperwork. -  All established patients should arrive 10-15 minutes earlier than appointment time to update all information and be checked in .  - In our best efforts to run on time, if you are late for your appointment you will be asked to either reschedule or if able, we will work you back into the schedule. There will be a wait time to work you back in the schedule,  depending on availability.  - If you are unable to make it to your appointment as scheduled, please call 24 hours ahead of time to allow [place] to fill the time slot with someone else who needs to be seen. If you do not cancel your appointment ahead of time, you may be charged a no show fee.

## 2016-09-05 ENCOUNTER — Other Ambulatory Visit: Payer: Self-pay | Admitting: Dermatology

## 2016-10-31 ENCOUNTER — Other Ambulatory Visit: Payer: Self-pay | Admitting: Dermatology

## 2017-01-31 ENCOUNTER — Encounter: Payer: Self-pay | Admitting: Family Medicine

## 2017-01-31 ENCOUNTER — Ambulatory Visit (INDEPENDENT_AMBULATORY_CARE_PROVIDER_SITE_OTHER): Payer: BLUE CROSS/BLUE SHIELD | Admitting: Family Medicine

## 2017-01-31 VITALS — BP 135/78 | HR 69 | Temp 98.1°F | Resp 20 | Ht 73.0 in | Wt 266.8 lb

## 2017-01-31 DIAGNOSIS — Z125 Encounter for screening for malignant neoplasm of prostate: Secondary | ICD-10-CM | POA: Diagnosis not present

## 2017-01-31 DIAGNOSIS — D126 Benign neoplasm of colon, unspecified: Secondary | ICD-10-CM

## 2017-01-31 DIAGNOSIS — Z0000 Encounter for general adult medical examination without abnormal findings: Secondary | ICD-10-CM

## 2017-01-31 DIAGNOSIS — K118 Other diseases of salivary glands: Secondary | ICD-10-CM

## 2017-01-31 DIAGNOSIS — Z23 Encounter for immunization: Secondary | ICD-10-CM

## 2017-01-31 DIAGNOSIS — I10 Essential (primary) hypertension: Secondary | ICD-10-CM | POA: Diagnosis not present

## 2017-01-31 DIAGNOSIS — R7309 Other abnormal glucose: Secondary | ICD-10-CM | POA: Diagnosis not present

## 2017-01-31 DIAGNOSIS — E785 Hyperlipidemia, unspecified: Secondary | ICD-10-CM | POA: Diagnosis not present

## 2017-01-31 DIAGNOSIS — E039 Hypothyroidism, unspecified: Secondary | ICD-10-CM | POA: Diagnosis not present

## 2017-01-31 DIAGNOSIS — K119 Disease of salivary gland, unspecified: Secondary | ICD-10-CM | POA: Diagnosis not present

## 2017-01-31 LAB — CBC WITH DIFFERENTIAL/PLATELET
BASOS PCT: 1.1 % (ref 0.0–3.0)
Basophils Absolute: 0.1 10*3/uL (ref 0.0–0.1)
EOS ABS: 0.1 10*3/uL (ref 0.0–0.7)
Eosinophils Relative: 2.3 % (ref 0.0–5.0)
HCT: 45 % (ref 39.0–52.0)
Hemoglobin: 14.9 g/dL (ref 13.0–17.0)
LYMPHS ABS: 1.4 10*3/uL (ref 0.7–4.0)
Lymphocytes Relative: 24.2 % (ref 12.0–46.0)
MCHC: 33.1 g/dL (ref 30.0–36.0)
MCV: 89.5 fl (ref 78.0–100.0)
MONO ABS: 0.4 10*3/uL (ref 0.1–1.0)
Monocytes Relative: 7.3 % (ref 3.0–12.0)
NEUTROS ABS: 3.7 10*3/uL (ref 1.4–7.7)
Neutrophils Relative %: 65.1 % (ref 43.0–77.0)
PLATELETS: 233 10*3/uL (ref 150.0–400.0)
RBC: 5.03 Mil/uL (ref 4.22–5.81)
RDW: 13.3 % (ref 11.5–15.5)
WBC: 5.7 10*3/uL (ref 4.0–10.5)

## 2017-01-31 LAB — TSH: TSH: 5.16 u[IU]/mL — AB (ref 0.35–4.50)

## 2017-01-31 LAB — COMPREHENSIVE METABOLIC PANEL
ALT: 25 U/L (ref 0–53)
AST: 21 U/L (ref 0–37)
Albumin: 4.5 g/dL (ref 3.5–5.2)
Alkaline Phosphatase: 41 U/L (ref 39–117)
BUN: 18 mg/dL (ref 6–23)
CALCIUM: 9.7 mg/dL (ref 8.4–10.5)
CO2: 28 meq/L (ref 19–32)
CREATININE: 1.56 mg/dL — AB (ref 0.40–1.50)
Chloride: 106 mEq/L (ref 96–112)
GFR: 48.98 mL/min — ABNORMAL LOW (ref >60.00)
Glucose, Bld: 106 mg/dL — ABNORMAL HIGH (ref 70–99)
Potassium: 4.3 mEq/L (ref 3.5–5.1)
SODIUM: 142 meq/L (ref 135–145)
Total Bilirubin: 0.8 mg/dL (ref 0.2–1.2)
Total Protein: 6.8 g/dL (ref 6.0–8.3)

## 2017-01-31 LAB — LIPID PANEL
Cholesterol: 138 mg/dL (ref 0–200)
HDL: 38.8 mg/dL — ABNORMAL LOW (ref >39.00)
LDL Cholesterol: 70 mg/dL (ref 0–99)
NonHDL: 99.2
TRIGLYCERIDES: 147 mg/dL (ref 0.0–149.0)
Total CHOL/HDL Ratio: 4
VLDL: 29.4 mg/dL (ref 0.0–40.0)

## 2017-01-31 LAB — PSA: PSA: 3.62 ng/mL (ref 0.10–4.00)

## 2017-01-31 LAB — HEMOGLOBIN A1C: Hgb A1c MFr Bld: 5.9 % (ref 4.6–6.5)

## 2017-01-31 MED ORDER — FENOFIBRATE 145 MG PO TABS: 145.0000 mg | ORAL_TABLET | Freq: Every day | ORAL | 3 refills | Status: AC

## 2017-01-31 MED ORDER — ROSUVASTATIN CALCIUM 20 MG PO TABS: 20.0000 mg | ORAL_TABLET | Freq: Every day | ORAL | 3 refills | Status: AC

## 2017-01-31 MED ORDER — RAMIPRIL 10 MG PO CAPS: 10.0000 mg | ORAL_CAPSULE | Freq: Every day | ORAL | 1 refills | Status: AC

## 2017-01-31 MED ORDER — OMEPRAZOLE 20 MG PO CPDR: 20.0000 mg | DELAYED_RELEASE_CAPSULE | Freq: Every day | ORAL | 3 refills | Status: AC

## 2017-01-31 MED ORDER — ZOSTER VAC RECOMB ADJUVANTED 50 MCG/0.5ML IM SUSR: 0.5000 mL | Freq: Once | INTRAMUSCULAR | 1 refills | Status: AC

## 2017-01-31 NOTE — Progress Notes (Signed)
Patient ID: Jared Gomez, male  DOB: [birth year], 56 y.o.   MRN: [id number] Patient Care Team    Relationship Specialty Notifications Start End  Ma Hillock, DO PCP - General Family Medicine  02/08/15   Lavonna Monarch, MD Consulting Physician Dermatology  01/31/17     Subjective: Jared Gomez is a 56 y.o. male present for CPE. All past medical history, surgical history, allergies, family history, immunizations, medications and social history were updated  in the electronic medical record today. All recent labs, ED visits and hospitalizations within the last year were reviewed.  Chronic problems reviewed and updated 01/31/2017  Hypothyroidism due to acquired atrophy of thyroid Patient reports compliance with levothyroxine 50 g daily on empty stomach. He has no side effects to medication. He denies flushing, palpitations, constipation or diarrhea. TSH was within normal limits today.  Essential hypertension, benign/Hyperlipidemia/BMI 33:  Patient reports compliance with ramipril 10 mg daily. He denies side effects to medication use. He denies chest pain, shortness of breath, dizziness or lower extremity edema. He states his blood pressures have been well-controlled. Patient is prescribed fenofibrate and Crestor 20 mg. He also endorses taking a daily fish oil supplementation.  Gastroesophageal reflux disease without esophagitis: Patient reports he had tried to wean himself off the Prilosec in the past, but was unable to do so. Without use daily he has return of his stomach burning and nausea.  Health maintenance:  Colonoscopy: 05/20/2016 Dr. Silverio Decamp, 3 year recall for multiple polyps- tubular adenoma Immunizations:  tdap 2014, influenza administered today Infectious disease screening: HIV and Hep C 01/2015 completed PSA:  Lab Results  Component Value Date   PSA 3.63 02/20/2016  , pt was counseled on prostate cancer screenings.  Assistive device: None Oxygen use: None Patient  has a Dental home. Hospitalizations/ED visits: reviewed  Depression screen [facility] 2/9 01/31/2017 02/23/2016  Decreased Interest 0 0  Down, Depressed, Hopeless 0 0  PHQ - 2 Score 0 0   Immunization History  Administered Date(s) Administered  . Influenza,inj,Quad PF,6+ Mos 02/23/2016, 01/31/2017    Past Medical History:  Diagnosis Date  . Basal cell carcinoma    skin; left back  . Gastric ulcer    prior PCP records  . High cholesterol   . Hypertension   . Near syncope 02/2015   "dizziness", improved on own, no work upper prior pcp notes  . Nephrolithiasis left   2016  . Sleep apnea 2015   CPAP  . Thyroid disease 2011   Allergies  Allergen Reactions  . Codeine    Past Surgical History:  Procedure Laterality Date  . BASAL CELL CARCINOMA EXCISION    . CARDIAC CATHETERIZATION  2015   Normal per priro PCP record  . CARDIOVASCULAR STRESS TEST  2015   normal per priro PCP record  . COLONOSCOPY  05/2016  . LYMPH NODE BIOPSY    . TONSILLECTOMY     Family History  Problem Relation Age of Onset  . Lung cancer Maternal Grandfather   . Parkinson's disease Father        died age 77  . Heart disease Father        stent placement ~84 yo  . Heart disease Paternal Grandfather   . Graves' disease Brother   . Colon cancer Neg Hx   . Esophageal cancer Neg Hx   . Rectal cancer Neg Hx   . Stomach cancer Neg Hx    Social History   Socioeconomic History  . Marital  status: Married    Spouse name: Not on file  . Number of children: Not on file  . Years of education: Not on file  . Highest education level: Not on file  Social Needs  . Financial resource strain: Not on file  . Food insecurity - worry: Not on file  . Food insecurity - inability: Not on file  . Transportation needs - medical: Not on file  . Transportation needs - non-medical: Not on file  Occupational History  . Not on file  Tobacco Use  . Smoking status: Never Smoker  . Smokeless tobacco: Never Used    Substance and Sexual Activity  . Alcohol use: Yes    Comment: Occassional   . Drug use: No  . Sexual activity: Yes  Other Topics Concern  . Not on file  Social History Narrative   Married to Jared Gomez. 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   Master's Degree.   Children: 2 kids   Wears seat belt, smoke detector located in the home, firearms located in the home a locked case.   Patient feels safe in his relationships.   Allergies as of 01/31/2017      Reactions   Codeine       Medication List        Accurate as of 01/31/17 12:33 PM. Always use your most recent med list.          aspirin 81 MG tablet Take 81 mg by mouth daily.   Coenzyme Q10 200 MG capsule Take 200 mg by mouth daily.   fenofibrate 145 MG tablet Commonly known as:  TRICOR Take 1 tablet (145 mg total) daily by mouth.   Fish Oil 1200 MG Caps Take 2 capsules by mouth daily.   levothyroxine 50 MCG tablet Commonly known as:  SYNTHROID, LEVOTHROID Take 1 tablet by mouth  daily on an empty stomach   omeprazole 20 MG capsule Commonly known as:  PRILOSEC Take 1 capsule (20 mg total) daily by mouth.   ramipril 10 MG capsule Commonly known as:  ALTACE Take 1 capsule (10 mg total) daily by mouth.   rosuvastatin 20 MG tablet Commonly known as:  CRESTOR Take 1 tablet (20 mg total) daily by mouth.   Zoster Vaccine Adjuvanted injection Commonly known as:  SHINGRIX Inject 0.5 mLs once for 1 dose into the muscle. Repeat dose in 2-6 months once        No results found for this or any previous visit (from the past 2160 hour(s)).  No results found.   ROS: 14 pt review of systems performed and negative (unless mentioned in an HPI)  Objective: BP 135/78 (BP Location: Right Arm, Patient Position: Sitting, Cuff Size: Large)   Pulse 69   Temp 98.1 F (36.7 C)   Resp 20   Ht 6\' 1"  (1.854 m)   Wt 266 lb 12 oz (121 kg)   SpO2 97%   BMI 35.19 kg/m  Gen: Afebrile. No acute distress. Nontoxic in appearance,  well developed, well nourished, obese caucasian male.  HENT: AT. [place]. ~ 2 cm oval non-tender mass right parotid area. Bilateral TM visualized and normal in appearance. MMM. Bilateral nares with erythema, no drainage or swelling. Throat without erythema or exudates. No  coug hor hoarseness Eyes:Pupils Equal Round Reactive to light, Extraocular movements intact,  Conjunctiva without redness, discharge or icterus. Neck/lymp/endocrine: Supple,no lymphadenopathy, no thyromegaly CV: RRR no murmur, no edema, +2/4 P posterior tibialis pulses Chest: CTAB, no wheeze or crackles Abd: Soft.  obese. NTND. BS present. no Masses palpated.  Skin: no rashes, purpura or petechiae.  Neuro/Msk:  Normal gait. PERLA. EOMi. Alert. Oriented x3. Cranial nerves II through XII intact. Muscle strength 5/5 upper/lower extremity. DTRs equal bilaterally. Psych: Normal affect, dress and demeanor. Normal speech. Normal thought content and judgment.    Assessment/plan: Jared Gomez is a 56 y.o. male present for CPE Encounter for preventive health examination Patient was encouraged to exercise greater than 150 minutes a week. Patient was encouraged to choose a diet filled with fresh fruits and vegetables, and lean meats. AVS provided to patient today for education/recommendation on gender specific health and safety maintenance. PSA: pt desired yearly screen.  Flu shot administered.  Shingrix printed.  Colonoscopy UTD.  tdap UTD.  Hypothyroidism due to acquired atrophy of thyroid - Stable.  - TSH collected - levothyroxine (SYNTHROID, LEVOTHROID) 50 MCG tablet; Take 1 tablet by mouth  daily on an empty stomach  Dispense: 90 tablet; Refill: 3 Essential hypertension, benign - Stable. Mildly higher, watch sodium. Monitor.  - continue Altace 10 mg QD, low salt diet and exercise. If borderline or elevated next visit would increase dose.  - refills today. Ramipril (ALTACE) 10 MG capsule; Take 1 capsule (10 mg total) by mouth daily.   Dispense: 90 capsule; Refill: 1 - F/U q 6 months Hyperlipidemia, unspecified hyperlipidemia type/BMI 33.0-33.9,adult - continue fenofibrate and crestor - rosuvastatin (CRESTOR) 20 MG tablet; Take 1 tablet (20 mg total) by mouth daily.  Dispense: 90 tablet; Refill: 3 - follow yearly.  Tubular adenoma:  - follow up in 3 year due 2021 Elevated hemoglobin A1c - improved last year, gained weight this year. Gastroesophageal reflux disease without esophagitis - Pt to try QOD dosing. He has been unable to discontinue medication completely.  - refills on Prilosec 20 mg today.  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 He was counseled on possible diagnosis from cancer to benign mass. He reports it has not changed and non-tender. He would like to monitor it for any changes and will call if he decides to move forward with further evaluation.    Return in about 1 year (around 01/31/2018) for CPE. 6 months chronic med  Electronically signed by: Howard Pouch, DO [place]

## 2017-01-31 NOTE — Patient Instructions (Signed)

## 2017-02-03 ENCOUNTER — Telehealth: Payer: Self-pay | Admitting: Family Medicine

## 2017-02-03 DIAGNOSIS — E034 Atrophy of thyroid (acquired): Secondary | ICD-10-CM

## 2017-02-03 MED ORDER — LEVOTHYROXINE SODIUM 75 MCG PO TABS: ORAL_TABLET | ORAL | 1 refills | Status: AC

## 2017-02-03 NOTE — Telephone Encounter (Signed)
Left detailed message with results and instructions on patient voice mail per DPR 

## 2017-02-03 NOTE — Telephone Encounter (Signed)
Please call pt:  - his labs have Mildy changed.    - His cholesterol look good. His blood counts are stable. Prostate screening is normal    - His kidney function has mildly declined. His diabetes screen is back to the "prediabteic range at 5.9 and his thyroid is mildly under replaced     - diet restrictions (sugar/carbs) and exercise will help decrease the a1c.     - I have increased his thyroid med (sent to [facility]) he can take 1.5 of the the tabs he currently  Has for a total dose of 75 mcg.  Recommend he follow up up in 3 months for provider appt and we will retest his BP (borderline), kidney and thyroid function.

## 2017-02-25 ENCOUNTER — Encounter: Payer: BLUE CROSS/BLUE SHIELD | Admitting: Family Medicine

## 2017-02-26 ENCOUNTER — Encounter: Payer: BLUE CROSS/BLUE SHIELD | Admitting: Family Medicine

## 2017-06-24 ENCOUNTER — Other Ambulatory Visit: Payer: Self-pay | Admitting: Family Medicine

## 2017-06-24 ENCOUNTER — Ambulatory Visit: Payer: Self-pay

## 2017-06-24 DIAGNOSIS — Z0000 Encounter for general adult medical examination without abnormal findings: Secondary | ICD-10-CM

## 2017-07-31 ENCOUNTER — Ambulatory Visit: Payer: BLUE CROSS/BLUE SHIELD | Admitting: Family Medicine

## 2017-09-15 ENCOUNTER — Telehealth: Payer: Self-pay | Admitting: Family Medicine

## 2017-09-15 DIAGNOSIS — I10 Essential (primary) hypertension: Secondary | ICD-10-CM

## 2017-09-15 DIAGNOSIS — E034 Atrophy of thyroid (acquired): Secondary | ICD-10-CM

## 2017-09-29 NOTE — Telephone Encounter (Signed)
Patient called to find out why his medication was not filled. He needs a call back as he has relocated to [place].

## 2017-09-30 NOTE — Telephone Encounter (Signed)
Patient notified that he would need to be seen for further refills due to not being seen since Nov. 2018. Patient stated that he was in town for 3 days and would be leaving tomorrow, attempted to schedule patient an appointment and patient hung up the phone.

## 2017-11-30 ENCOUNTER — Inpatient Hospital Stay
Admit: 2017-11-30 | Discharge: 2017-11-30 | Disposition: A | Discharge status: Home / Self Care | Attending: Emergency Medicine

## 2017-11-30 DIAGNOSIS — S62665A Nondisplaced fracture of distal phalanx of left ring finger, initial encounter for closed fracture: Secondary | ICD-10-CM

## 2017-11-30 MED ORDER — IBUPROFEN 600 MG PO TABS
600 MG | ORAL_TABLET | Freq: Four times a day (QID) | ORAL | 0 refills | Status: AC | PRN
Start: 2017-11-30 — End: ?

## 2017-11-30 NOTE — ED Notes (Signed)
DC with radiology disc of hand/ finger     Geoffery Lyons, RN  11/30/17 1606

## 2017-11-30 NOTE — ED Triage Notes (Signed)
Patient arrives with c/o left 4th digit injury. Patient states he was trying to pull tile off the wall and his finger got wedged in between. +pain and injury to tip of left 4th digit. No other injuries reported. +left radial pulse. Call bell bedside.

## 2017-11-30 NOTE — Other (Unsigned)
Patient Acct Nbr: [id number]   Primary AUTH/CERT:   Primary Insurance Company Name: [employer]  Primary Insurance Plan name: [facility] [place]  Primary Insurance Group Number: [id number]  Primary Insurance Plan Type: Health  Primary Insurance Policy Number: [id number]

## 2017-11-30 NOTE — ED Provider Notes (Signed)
[facility] ED  eMERGENCY dEPARTMENT eNCOUnter      Pt Name: Bradley Watkins  MRN: [id number]  Birthdate 09/16/1960  Date of evaluation: 11/30/2017  Provider: Lidia Collum, MD     CHIEF COMPLAINT       Chief Complaint   Patient presents with   . Hand Injury     left 4th finger     History from patient    HISTORY OF PRESENT ILLNESS   (Location/Symptom, Timing/Onset, Context/Setting, Quality, Duration, Modifying Factors, Severity) Note limiting factors.   HPI    Bradley Watkins is a 57 y.o. male who presents to the emergency department complaining of pain in his left ring finger.  Proximal one hour ago he was working with tile when he caught his finger between 2 pieces.  Crush injury to the distal phalanx of his ring finger.  No other injury.  No previous injury.  He is able to move his finger.  Pain is dull moderate aching.  Movement or touching makes it worse.  Nothing makes it better.  He came directly to the emergency department.  No other complaint.     Nursing Notes were reviewed.    REVIEW OF SYSTEMS    (2+ for level 4; 10+ for level 5)   Review of Systems   Constitutional: Negative for activity change, chills and fatigue.   HENT: Negative for congestion, ear pain, rhinorrhea and sore throat.    Eyes: Negative for pain, discharge and itching.   Respiratory: Negative for cough, choking and shortness of breath.    Cardiovascular: Negative for chest pain, palpitations and leg swelling.   Gastrointestinal: Negative for diarrhea, nausea and vomiting.   Genitourinary: Negative for dysuria, frequency and hematuria.   Musculoskeletal: Negative for back pain, gait problem, joint swelling, myalgias and neck pain.   Skin: Negative for color change, pallor, rash and wound.   Allergic/Immunologic: Negative for immunocompromised state.   Neurological: Negative for tremors, speech difficulty and numbness.   Psychiatric/Behavioral: Negative for dysphoric mood, self-injury and suicidal ideas.       PAST MEDICAL HISTORY      Past Medical History:   Diagnosis Date   . History of concussion    . Hypercholesteremia    . Obesity    . Systemic primary arterial hypertension    . Thyroid disease        SURGICAL HISTORY       Past Surgical History:   Procedure Laterality Date   . TONSILLECTOMY         CURRENT MEDICATIONS       Previous Medications    ASPIRIN 325 MG TABLET    Take 325 mg by mouth daily.      FENOFIBRATE (TRICOR) 145 MG TABLET    Take 145 mg by mouth    LEVOTHYROXINE (SYNTHROID) 75 MCG TABLET    Take 75 mcg by mouth    RAMIPRIL (ALTACE) 5 MG CAPSULE    Take 10 mg by mouth daily.      ROSUVASTATIN (CRESTOR) 10 MG TABLET    Take 10 mg by mouth daily.         ALLERGIES     Codeine    FAMILY HISTORY     History reviewed. No pertinent family history.  Heart disease     SOCIAL HISTORY     Married  right-hand-dominant.  She has had drainage or  Nonsmoker  Nondrinker  no street drugs    Social History  Socioeconomic History   . Marital status: Married     Spouse name: None   . Number of children: None   . Years of education: None   . Highest education level: None   Occupational History   . None   Social Needs   . Financial resource strain: None   . Food insecurity:     Worry: None     Inability: None   . Transportation needs:     Medical: None     Non-medical: None   Tobacco Use   . Smoking status: Never Smoker   . Smokeless tobacco: Never Used   Substance and Sexual Activity   . Alcohol use: Yes     Frequency: Never     Comment: weekly   . Drug use: Never   . Sexual activity: Never   Lifestyle   . Physical activity:     Days per week: None     Minutes per session: None   . Stress: None   Relationships   . Social connections:     Talks on phone: None     Gets together: None     Attends religious service: None     Active member of club or organization: None     Attends meetings of clubs or organizations: None     Relationship status: None   . Intimate partner violence:     Fear of current or ex partner: None     Emotionally abused:  None     Physically abused: None     Forced sexual activity: None   Other Topics Concern   . None   Social History Narrative   . None       SCREENINGS           PHYSICAL EXAM    (up to 7 for level 4, 8 or more for level 5)     ED Triage Vitals [11/30/17 1511]   BP Temp Temp Source Pulse Resp SpO2 Height Weight   (!) 151/96 97.5 F (36.4 C) Oral 100 16 97 % 6\' 1"  (1.854 m) 250 lb (113.4 kg)       Physical Exam   Constitutional: He is oriented to person, place, and time. He appears well-developed and well-nourished.   HENT:   Head: Normocephalic and atraumatic.   Mouth/Throat: Oropharynx is clear and moist.   Eyes: Pupils are equal, round, and reactive to light. Conjunctivae are normal. Right eye exhibits no discharge. Left eye exhibits no discharge.   Neck: Normal range of motion. No JVD present. No tracheal deviation present.   Cardiovascular: Normal rate, regular rhythm and normal heart sounds.   No murmur heard.  Pulmonary/Chest: Effort normal. No stridor. No respiratory distress. He has no wheezes. He has no rales.   Abdominal: Soft. He exhibits no distension. There is no tenderness. There is no rebound.   Musculoskeletal: Normal range of motion. He exhibits no edema, tenderness or deformity.   Neurological: He is alert and oriented to person, place, and time. He displays normal reflexes. No cranial nerve deficit or sensory deficit. He exhibits normal muscle tone. Coordination normal.   Skin: Skin is warm and dry. No rash noted. He is not diaphoretic. No erythema.   Psychiatric: He has a normal mood and affect. His behavior is normal. Thought content normal.   Left hand: Tender distal phalanx fourth digit, no subungual hematoma, full range of motion including each joint and isolation, neurovascularly intact    DIAGNOSTIC RESULTS  RADIOLOGY (Per Emergency Physician):   Left hand x-ray - fracture distal phalanx    Interpretation per the Radiologist below, if available at the time of this note:  Xr Hand Left  (min 3 Views)    Result Date: 11/30/2017  Patient Name:  Bradley Watkins MRN:  [id number] FIN:  [id number] ---Diagnostic Radiology--- Exam Date/Time        11/30/2017 15:24:48 EDT                             Exam                  CR Hand Complete 3+ Views Left                      Ordering Physician    [id number]                            Accession Number      [id number]                                       CPT4 Codes 48546 () Reason For Exam injury pain 4th distal phalamx Report Left  hand: 11/30/2017. Clinical Information: Pain after trauma. Findings: 3 views of the left hand reveal the bones to be well mineralized. There is a nondisplaced oblique fracture of the distal phalanx of the fourth finger. The fracture line does not extend into the articular surface. No other bony abnormalities are identified. Report Dictated on Workstation: [id number] ---** Final ---** Dictating Physician: Shea Evans, MD, RISA Signed Date and Time: 11/30/2017 3:28 pm Signed by: Shea Evans, MD, RISA Transcribed Date and Time: 11/30/2017 3:29      EMERGENCY DEPARTMENT COURSE and DIFFERENTIAL DIAGNOSIS/MDM:   Vitals:    Vitals:    11/30/17 1511   BP: (!) 151/96   Pulse: 100   Resp: 16   Temp: 97.5 F (36.4 C)   TempSrc: Oral   SpO2: 97%   Weight: 113.4 kg (250 lb)   Height: 6\' 1"  (1.854 m)       Medications - No data to display    MDM.  57 year old male with crush injury to left ring finger.  No injury elsewhere by history or exam.  Full range of motion and neurovascularly intact.  Imaging shows a fracture, splinted in the emergency department.  Fracture care reviewed. Patient agrees with symptomatic treatment and PCP follow-up as an outpatient.    REVAL:   3:49 PM All data now available and reviewed with patient.      PROCEDURES:  Unless otherwise noted below, none     Splint Application  Date/Time: 11/30/2017 3:55 PM  Performed by: Lidia Collum, MD  Authorized by: Lidia Collum, MD     Consent:     Consent obtained:   Verbal    Consent given by:  Patient    Risks discussed:  Discoloration    Alternatives discussed:  No treatment  Pre-procedure details:     Sensation:  Normal  Procedure details:     Laterality:  Left    Location:  Finger    Finger:  L ring finger    Cast type:  Finger    Supplies:  Aluminum splint  Post-procedure details:  Pain:  Unchanged    Sensation:  Normal    Patient tolerance of procedure:  Tolerated well, no immediate complications        FINAL IMPRESSION      1. Closed nondisplaced fracture of distal phalanx of left ring finger, initial encounter          DISPOSITION/PLAN   DISPOSITION Decision To Discharge 11/30/2017 03:55:52 PM      PATIENT REFERRED TO:  [facility] [address]  [address]  [address] [place] [address]  [phone]    As needed      DISCHARGE MEDICATIONS:  New Prescriptions    IBUPROFEN (IBU) 600 MG TABLET    Take 1 tablet by mouth every 6 hours as needed for Pain          (Please note:  Portions of this note were completed with a voice recognition program.  Efforts were made to edit the dictations but occasionally words and phrases are mis-transcribed.)  Form v2016.J.5-cn    Lidia Collum, MD (electronically signed)  Emergency Medicine Provider        Lidia Collum, MD  11/30/17 [phone]

## 2017-11-30 NOTE — ED Notes (Signed)
Finger splint applied per Dr Gwenyth BenderJouriles .     Geoffery LyonsPamela Dontasia Miranda, RN  11/30/17 [phone]

## 2018-11-23 IMAGING — DX DG CHEST 1V
1 series · 1 of 1 positions shown · non-contrast
Comparison: None.

CLINICAL DATA: Physical examination.  History of hypertension

EXAM:
CHEST  1 VIEW

[chest pa]
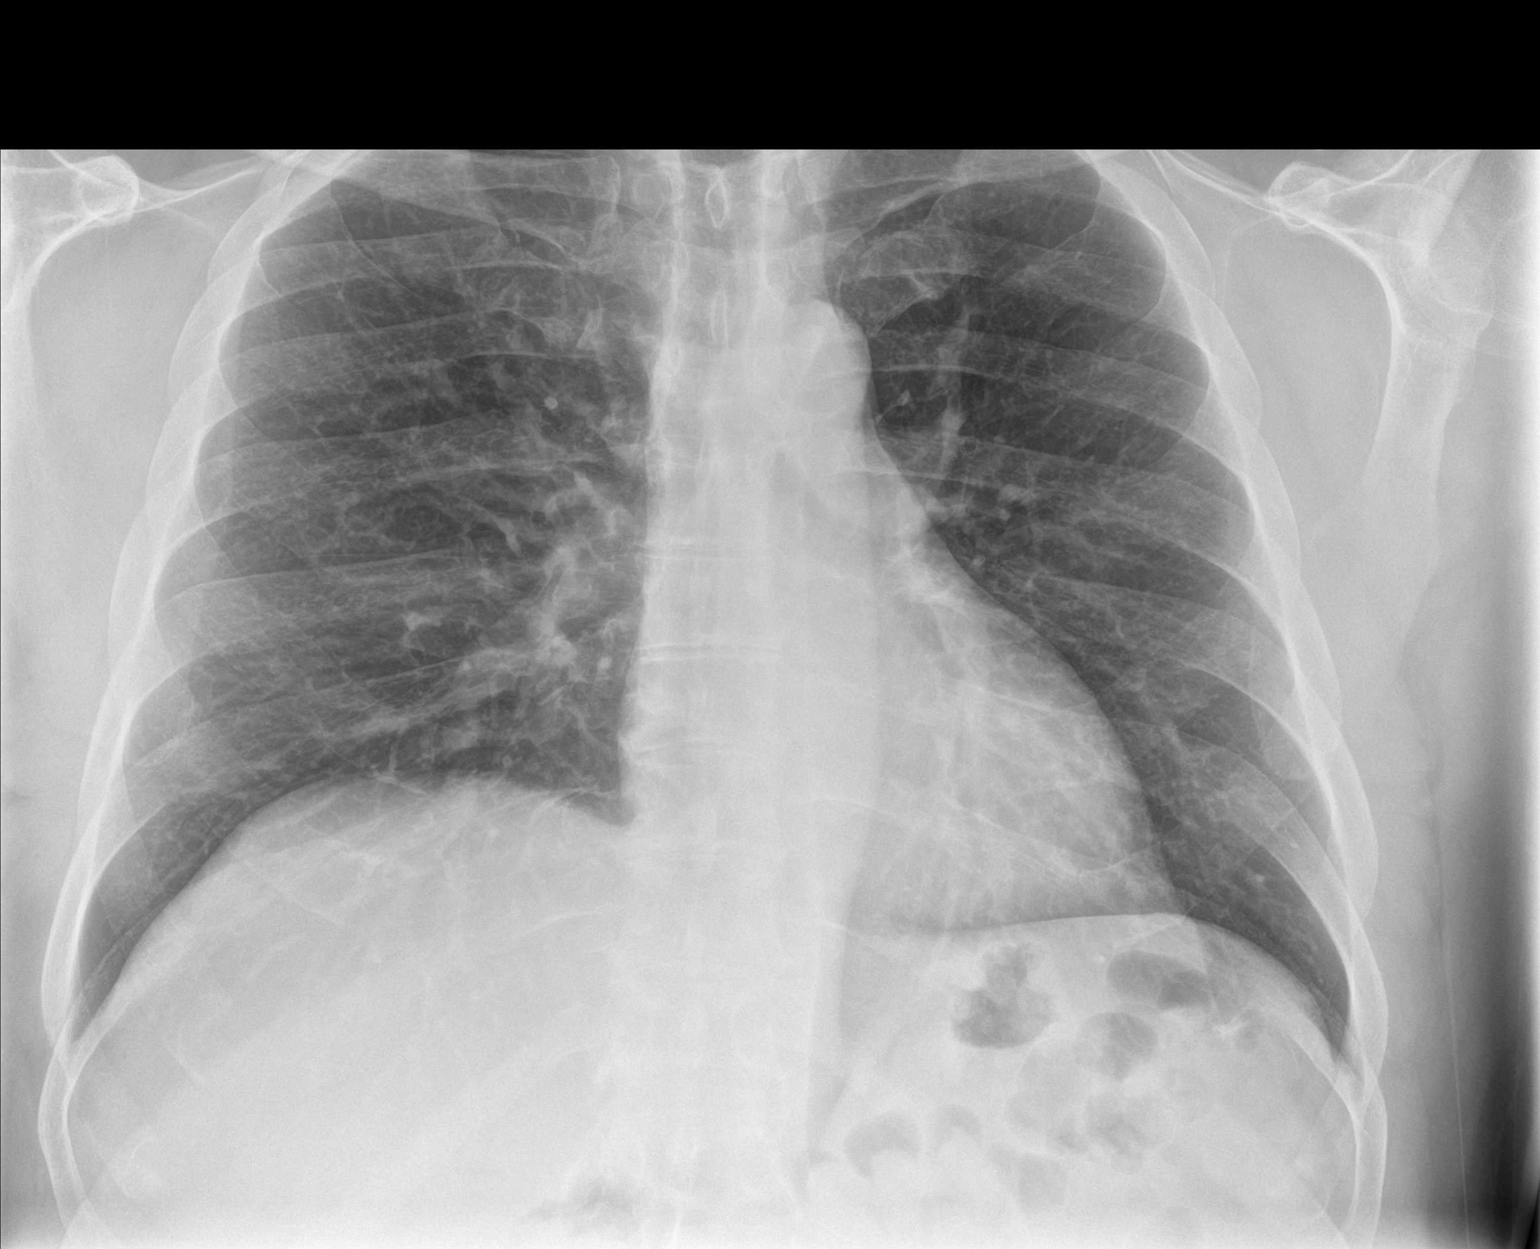

[1 of 1 positions shown; findings below may reference images not displayed]

FINDINGS: Lungs are clear. The heart size and pulmonary vascularity are
normal. No adenopathy. No bone lesions.
IMPRESSION: No edema or consolidation.
